# Patient Record
Sex: Male | Born: 1965 | Race: Black or African American | Hispanic: No | Marital: Single | Smoking: Current every day smoker
Health system: Southern US, Community
[De-identification: ages and names within clinical notes are randomized; demographics above are authoritative.]

## PROBLEM LIST (undated history)

## (undated) DIAGNOSIS — S2249XA Multiple fractures of ribs, unspecified side, initial encounter for closed fracture: Secondary | ICD-10-CM

## (undated) DIAGNOSIS — J40 Bronchitis, not specified as acute or chronic: Secondary | ICD-10-CM

## (undated) DIAGNOSIS — S2239XA Fracture of one rib, unspecified side, initial encounter for closed fracture: Secondary | ICD-10-CM

## (undated) DIAGNOSIS — I10 Essential (primary) hypertension: Secondary | ICD-10-CM

## (undated) DIAGNOSIS — J45909 Unspecified asthma, uncomplicated: Secondary | ICD-10-CM

## (undated) HISTORY — PX: NO PAST SURGERIES: SHX2092

---

## 2015-05-20 ENCOUNTER — Encounter (HOSPITAL_COMMUNITY): Payer: Self-pay | Admitting: Emergency Medicine

## 2015-05-20 ENCOUNTER — Emergency Department (HOSPITAL_COMMUNITY): Payer: Self-pay

## 2015-05-20 ENCOUNTER — Emergency Department (HOSPITAL_COMMUNITY)
Admission: EM | Admit: 2015-05-20 | Discharge: 2015-05-21 | Disposition: A | Discharge status: Home / Self Care | Payer: Self-pay | Attending: Emergency Medicine | Admitting: Emergency Medicine

## 2015-05-20 DIAGNOSIS — J45901 Unspecified asthma with (acute) exacerbation: Secondary | ICD-10-CM

## 2015-05-20 DIAGNOSIS — J159 Unspecified bacterial pneumonia: Secondary | ICD-10-CM | POA: Insufficient documentation

## 2015-05-20 DIAGNOSIS — J189 Pneumonia, unspecified organism: Secondary | ICD-10-CM

## 2015-05-20 DIAGNOSIS — F17200 Nicotine dependence, unspecified, uncomplicated: Secondary | ICD-10-CM | POA: Insufficient documentation

## 2015-05-20 DIAGNOSIS — Z8781 Personal history of (healed) traumatic fracture: Secondary | ICD-10-CM | POA: Insufficient documentation

## 2015-05-20 HISTORY — DX: Multiple fractures of ribs, unspecified side, initial encounter for closed fracture: S22.49XA

## 2015-05-20 HISTORY — DX: Fracture of one rib, unspecified side, initial encounter for closed fracture: S22.39XA

## 2015-05-20 HISTORY — DX: Unspecified asthma, uncomplicated: J45.909

## 2015-05-20 HISTORY — DX: Bronchitis, not specified as acute or chronic: J40

## 2015-05-20 LAB — BASIC METABOLIC PANEL
ANION GAP: 15 (ref 5–15)
BUN: 5 mg/dL — ABNORMAL LOW (ref 6–20)
CHLORIDE: 107 mmol/L (ref 101–111)
CO2: 20 mmol/L — AB (ref 22–32)
Calcium: 9.8 mg/dL (ref 8.9–10.3)
Creatinine, Ser: 0.86 mg/dL (ref 0.61–1.24)
GFR calc non Af Amer: >60 mL/min (ref >60)
GLUCOSE: 95 mg/dL (ref 65–99)
Potassium: 4.3 mmol/L (ref 3.5–5.1)
Sodium: 142 mmol/L (ref 135–145)

## 2015-05-20 LAB — I-STAT TROPONIN, ED: Troponin i, poc: 0 ng/mL (ref 0.00–0.08)

## 2015-05-20 LAB — CBC
HEMATOCRIT: 48.4 % (ref 39.0–52.0)
HEMOGLOBIN: 16.8 g/dL (ref 13.0–17.0)
MCH: 33.2 pg (ref 26.0–34.0)
MCHC: 34.7 g/dL (ref 30.0–36.0)
MCV: 95.7 fL (ref 78.0–100.0)
Platelets: 288 10*3/uL (ref 150–400)
RBC: 5.06 MIL/uL (ref 4.22–5.81)
RDW: 12.5 % (ref 11.5–15.5)
WBC: 7.4 10*3/uL (ref 4.0–10.5)

## 2015-05-20 MED ORDER — IPRATROPIUM-ALBUTEROL 0.5-2.5 (3) MG/3ML IN SOLN
3.0000 mL | Freq: Once | RESPIRATORY_TRACT | Status: AC
  Administered 2015-05-20: 3 mL via RESPIRATORY_TRACT
  Filled 2015-05-20: qty 3

## 2015-05-20 MED ORDER — METHYLPREDNISOLONE SODIUM SUCC 125 MG IJ SOLR
125.0000 mg | Freq: Once | INTRAMUSCULAR | Status: AC
  Administered 2015-05-20: 125 mg via INTRAVENOUS
  Filled 2015-05-20: qty 2

## 2015-05-20 NOTE — ED Provider Notes (Signed)
CSN: [id number]     Arrival date & time 05/20/15  2139 History   First MD Initiated Contact with Patient 05/20/15 2204     Chief Complaint  Patient presents with  . Respiratory Distress  . Chest Pain     (Consider location/radiation/quality/duration/timing/severity/associated sxs/prior Treatment) Patient is a 50 y.o. male presenting with chest pain. The history is provided by the patient.  Chest Pain Associated symptoms: cough and shortness of breath   Associated symptoms: no abdominal pain, no fever and no headache    patient with a history of asthma. Stated that he started having significant breathing problems today. Felt like he was wheezing. Associated with some discomfort in the chest. Patient states that he is felt like maybe is coming down with a cold but has not had a fever does not have a sore throat. Patient does not have albuterol at home.  Past Medical History  Diagnosis Date  . Bronchitis   . Broken ribs   . Asthma    History reviewed. No pertinent past surgical history. No family history on file. Social History  Substance Use Topics  . Smoking status: Current Every Day Smoker  . Smokeless tobacco: None  . Alcohol Use: Yes    Review of Systems  Constitutional: Negative for fever.  HENT: Positive for congestion. Negative for sore throat.   Eyes: Negative for visual disturbance.  Respiratory: Positive for cough, shortness of breath and wheezing.   Cardiovascular: Positive for chest pain.  Gastrointestinal: Negative for abdominal pain.  Genitourinary: Negative for dysuria.  Musculoskeletal: Negative for myalgias.  Skin: Negative for rash.  Neurological: Negative for headaches.  Psychiatric/Behavioral: Negative for confusion.      Allergies  Review of patient's allergies indicates no known allergies.  Home Medications   Prior to Admission medications   Medication Sig Start Date End Date Taking? Authorizing Provider  acetaminophen (TYLENOL) 325 MG tablet  Take 325 mg by mouth every 6 (six) hours as needed for mild pain.   Yes Historical Provider, MD  guaiFENesin (MUCINEX) 600 MG 12 hr tablet Take 1,200 mg by mouth 2 (two) times daily as needed for cough or to loosen phlegm.   Yes Historical Provider, MD  azithromycin (ZITHROMAX) 250 MG tablet Take 1 tablet (250 mg total) by mouth daily. Take first 2 tablets together, then 1 every day until finished. 05/21/15   Vanetta MuldersScott Wilfred Dayrit, MD  predniSONE (DELTASONE) 10 MG tablet Take 4 tablets (40 mg total) by mouth daily. 05/21/15   Vanetta MuldersScott Aodhan Scheidt, MD   BP 150/103 mmHg  Pulse 105  Temp(Src) 98.4 F (36.9 C) (Oral)  Resp 12  Ht 5\' 3"  (1.6 m)  Wt 68.493 kg  BMI 26.76 kg/m2  SpO2 98% Physical Exam  Constitutional: He is oriented to person, place, and time. He appears well-developed and well-nourished. No distress.  HENT:  Head: Normocephalic and atraumatic.  Mouth/Throat: Oropharynx is clear and moist.  Eyes: Conjunctivae and EOM are normal. Pupils are equal, round, and reactive to light.  Neck: Normal range of motion.  Cardiovascular: Normal rate and regular rhythm.  Exam reveals no friction rub.   No murmur heard. Pulmonary/Chest: He is in respiratory distress. He has wheezes.  Abdominal: Soft. Bowel sounds are normal. There is no tenderness.  Musculoskeletal: Normal range of motion.  Neurological: He is alert and oriented to person, place, and time. No cranial nerve deficit. He exhibits normal muscle tone. Coordination normal.  Skin: Skin is warm. No rash noted.  Nursing note and vitals  reviewed.   ED Course  Procedures (including critical care time) Labs Review Labs Reviewed  BASIC METABOLIC PANEL - Abnormal; Notable for the following:    CO2 20 (*)    BUN 5 (*)    All other components within normal limits  CBC  I-STAT TROPOININ, ED   Results for orders placed or performed during the hospital encounter of 05/20/15  Basic metabolic panel  Result Value Ref Range   Sodium 142 135 - 145  mmol/L   Potassium 4.3 3.5 - 5.1 mmol/L   Chloride 107 101 - 111 mmol/L   CO2 20 (L) 22 - 32 mmol/L   Glucose, Bld 95 65 - 99 mg/dL   BUN 5 (L) 6 - 20 mg/dL   Creatinine, Ser 7.42 0.61 - 1.24 mg/dL   Calcium 9.8 8.9 - 59.5 mg/dL   GFR calc non Af Amer >60 >60 mL/min   GFR calc Af Amer >60 >60 mL/min   Anion gap 15 5 - 15  CBC  Result Value Ref Range   WBC 7.4 4.0 - 10.5 K/uL   RBC 5.06 4.22 - 5.81 MIL/uL   Hemoglobin 16.8 13.0 - 17.0 g/dL   HCT 63.8 75.6 - 43.3 %   MCV 95.7 78.0 - 100.0 fL   MCH 33.2 26.0 - 34.0 pg   MCHC 34.7 30.0 - 36.0 g/dL   RDW 29.5 18.8 - 41.6 %   Platelets 288 150 - 400 K/uL  I-stat troponin, ED (not at [facility], [facility])  Result Value Ref Range   Troponin i, poc 0.00 0.00 - 0.08 ng/mL   Comment 3            Imaging Review Dg Chest Portable 1 View  05/20/2015  CLINICAL DATA:  Shortness of breath, wheezing, and cough for 1 day. EXAM: PORTABLE CHEST 1 VIEW COMPARISON:  None. FINDINGS: Suggestion of slight nodular infiltration in the right upper lung and left lower lung. This may indicate patchy areas of pneumonia or mucous plugging. Normal heart size and pulmonary vascularity. No blunting of costophrenic angles. No pneumothorax. Mediastinal contours appear intact. IMPRESSION: Patchy areas of nodular infiltration in the right upper lung and left lower lung. Electronically Signed   By: Burman Nieves M.D.   On: 05/20/2015 22:20   I have personally reviewed and evaluated these images and lab results as part of my medical decision-making.   EKG Interpretation   Date/Time:  Sunday May 20 2015 21:45:54 EST Ventricular Rate:  114 PR Interval:  150 QRS Duration: 84 QT Interval:  320 QTC Calculation: 441 R Axis:   9 Text Interpretation:  Sinus tachycardia Septal infarct , age undetermined  Abnormal ECG Confirmed by Latrecia Capito  MD, Leahmarie Gasiorowski [phone]) on 05/20/2015  10:11:23 PM      MDM   Final diagnoses:  Asthma, unspecified asthma severity, with acute exacerbation   CAP (community acquired pneumonia)   Patient with a history of asthma presents with a asthma exacerbation. Improve significantly with 2 nebulizers of albuterol and Atrovent. Patient also given site Medrol. Wheezing is minimal now oxygen saturation saturations are 92%. Chest x-ray raises question of possible early pneumonia. Will treat with Zithromax. Patient will go home with prednisone and albuterol inhaler and will follow-up wellness clinic. Patient return for any new or worse symptoms.  Patient's labs without any significant abnormalities. Patient without any fever.     Vanetta Mulders, MD 05/21/15 [phone]

## 2015-05-20 NOTE — ED Notes (Signed)
Pt arrived to triage in resp distress with pain across chest and sob all day.  Denies nausea and vomiting.  Audible wheezing. Unable to speak in complete sentences.

## 2015-05-21 MED ORDER — ALBUTEROL SULFATE HFA 108 (90 BASE) MCG/ACT IN AERS
2.0000 | INHALATION_SPRAY | Freq: Four times a day (QID) | RESPIRATORY_TRACT | Status: DC
  Administered 2015-05-21: 2 via RESPIRATORY_TRACT
  Filled 2015-05-21: qty 6.7

## 2015-05-21 MED ORDER — PREDNISONE 10 MG PO TABS: 40.0000 mg | ORAL_TABLET | Freq: Every day | ORAL | Status: DC

## 2015-05-21 MED ORDER — AZITHROMYCIN 250 MG PO TABS
500.0000 mg | ORAL_TABLET | Freq: Once | ORAL | Status: AC
  Administered 2015-05-21: 500 mg via ORAL
  Filled 2015-05-21: qty 2

## 2015-05-21 MED ORDER — AZITHROMYCIN 250 MG PO TABS: 250.0000 mg | ORAL_TABLET | Freq: Every day | ORAL | Status: DC

## 2015-05-21 NOTE — Discharge Instructions (Signed)
Asthma, Acute Bronchospasm °Acute bronchospasm caused by asthma is also referred to as an asthma attack. Bronchospasm means your air passages become narrowed. The narrowing is caused by inflammation and tightening of the muscles in the air tubes (bronchi) in your lungs. This can make it hard to breathe or cause you to wheeze and cough. °CAUSES °Possible triggers are: °· Animal dander from the skin, hair, or feathers of animals. °· Dust mites contained in house dust. °· Cockroaches. °· Pollen from trees or grass. °· Mold. °· Cigarette or tobacco smoke. °· Air pollutants such as dust, household cleaners, hair sprays, aerosol sprays, paint fumes, strong chemicals, or strong odors. °· Cold air or weather changes. Cold air may trigger inflammation. Winds increase molds and pollens in the air. °· Strong emotions such as crying or laughing hard. °· Stress. °· Certain medicines such as aspirin or beta-blockers. °· Sulfites in foods and drinks, such as dried fruits and wine. °· Infections or inflammatory conditions, such as a flu, cold, or inflammation of the nasal membranes (rhinitis). °· Gastroesophageal reflux disease (GERD). GERD is a condition where stomach acid backs up into your esophagus. °· Exercise or strenuous activity. °SIGNS AND SYMPTOMS  °· Wheezing. °· Excessive coughing, particularly at night. °· Chest tightness. °· Shortness of breath. °DIAGNOSIS  °Your health care provider will ask you about your medical history and perform a physical exam. A chest X-ray or blood testing may be performed to look for other causes of your symptoms or other conditions that may have triggered your asthma attack.  °TREATMENT  °Treatment is aimed at reducing inflammation and opening up the airways in your lungs.  Most asthma attacks are treated with inhaled medicines. These include quick relief or rescue medicines (such as bronchodilators) and controller medicines (such as inhaled corticosteroids). These medicines are sometimes  given through an inhaler or a nebulizer. Systemic steroid medicine taken by mouth or given through an IV tube also can be used to reduce the inflammation when an attack is moderate or severe. Antibiotic medicines are only used if a bacterial infection is present.  °HOME CARE INSTRUCTIONS  °· Rest. °· Drink plenty of liquids. This helps the mucus to remain thin and be easily coughed up. Only use caffeine in moderation and do not use alcohol until you have recovered from your illness. °· Do not smoke. Avoid being exposed to secondhand smoke. °· You play a critical role in keeping yourself in good health. Avoid exposure to things that cause you to wheeze or to have breathing problems. °· Keep your medicines up-to-date and available. Carefully follow your health care provider's treatment plan. °· Take your medicine exactly as prescribed. °· When pollen or pollution is bad, keep windows closed and use an air conditioner or go to places with air conditioning. °· Asthma requires careful medical care. See your health care provider for a follow-up as advised. If you are more than [redacted] weeks pregnant and you were prescribed any new medicines, let your obstetrician know about the visit and how you are doing. Follow up with your health care provider as directed. °· After you have recovered from your asthma attack, make an appointment with your outpatient doctor to talk about ways to reduce the likelihood of future attacks. If you do not have a doctor who manages your asthma, make an appointment with a primary care doctor to discuss your asthma. °SEEK IMMEDIATE MEDICAL CARE IF:  °· You are getting worse. °· You have trouble breathing. If severe, call your local   emergency services (911 in the [place]  You develop chest pain or discomfort.  You are vomiting.  You are not able to keep fluids down.  You are coughing up yellow, green, brown, or bloody sputum.  You have a fever and your symptoms suddenly get worse.  You have  trouble swallowing. MAKE SURE YOU:   Understand these instructions.  Will watch your condition.  Will get help right away if you are not doing well or get worse.   This information is not intended to replace advice given to you by your health care provider. Make sure you discuss any questions you have with your health care provider.   Use albuterol inhaler 2 puffs every 6 hours for the next 7 days. Then as needed. Take prednisone for the next 5 days. Take the antibiotic as directed. Return for any new or worse symptoms. Make an appointment to follow-up with the wellness clinic.   Document Released: 06/18/2006 Document Revised: 03/08/2013 Document Reviewed: 09/08/2012 Elsevier Interactive Patient Education [employer]2016 [employer] Inc.

## 2015-05-21 NOTE — ED Notes (Signed)
Patient verbalized understanding of discharge instructions and denies any further needs or questions at this time. VS stable. Patient ambulatory with steady gait.  

## 2016-09-19 ENCOUNTER — Emergency Department (HOSPITAL_COMMUNITY)
Admission: EM | Admit: 2016-09-19 | Discharge: 2016-09-19 | Disposition: A | Discharge status: Home / Self Care | Payer: Self-pay | Attending: Emergency Medicine | Admitting: Emergency Medicine

## 2016-09-19 ENCOUNTER — Emergency Department (HOSPITAL_COMMUNITY): Payer: Self-pay

## 2016-09-19 ENCOUNTER — Encounter (HOSPITAL_COMMUNITY): Payer: Self-pay

## 2016-09-19 DIAGNOSIS — Z7951 Long term (current) use of inhaled steroids: Secondary | ICD-10-CM | POA: Insufficient documentation

## 2016-09-19 DIAGNOSIS — J4521 Mild intermittent asthma with (acute) exacerbation: Secondary | ICD-10-CM | POA: Insufficient documentation

## 2016-09-19 DIAGNOSIS — F17210 Nicotine dependence, cigarettes, uncomplicated: Secondary | ICD-10-CM | POA: Insufficient documentation

## 2016-09-19 MED ORDER — ALBUTEROL SULFATE (2.5 MG/3ML) 0.083% IN NEBU
5.0000 mg | INHALATION_SOLUTION | Freq: Once | RESPIRATORY_TRACT | Status: AC
  Administered 2016-09-19: 5 mg via RESPIRATORY_TRACT

## 2016-09-19 MED ORDER — IPRATROPIUM-ALBUTEROL 0.5-2.5 (3) MG/3ML IN SOLN
RESPIRATORY_TRACT | Status: AC
  Filled 2016-09-19: qty 3

## 2016-09-19 MED ORDER — PREDNISONE 10 MG PO TABS: 40.0000 mg | ORAL_TABLET | Freq: Every day | ORAL | 0 refills | Status: DC

## 2016-09-19 MED ORDER — ALBUTEROL SULFATE HFA 108 (90 BASE) MCG/ACT IN AERS
2.0000 | INHALATION_SPRAY | Freq: Once | RESPIRATORY_TRACT | Status: AC
  Administered 2016-09-19: 2 via RESPIRATORY_TRACT
  Filled 2016-09-19: qty 6.7

## 2016-09-19 MED ORDER — IPRATROPIUM-ALBUTEROL 0.5-2.5 (3) MG/3ML IN SOLN
3.0000 mL | Freq: Once | RESPIRATORY_TRACT | Status: AC
  Administered 2016-09-19: 3 mL via RESPIRATORY_TRACT

## 2016-09-19 MED ORDER — ALBUTEROL SULFATE HFA 108 (90 BASE) MCG/ACT IN AERS: 2.0000 | INHALATION_SPRAY | RESPIRATORY_TRACT | 1 refills | Status: DC | PRN

## 2016-09-19 MED ORDER — PREDNISONE 20 MG PO TABS
60.0000 mg | ORAL_TABLET | Freq: Once | ORAL | Status: AC
  Administered 2016-09-19: 60 mg via ORAL
  Filled 2016-09-19: qty 3

## 2016-09-19 MED ORDER — ALBUTEROL SULFATE (2.5 MG/3ML) 0.083% IN NEBU
INHALATION_SOLUTION | RESPIRATORY_TRACT | Status: AC
  Filled 2016-09-19: qty 6

## 2016-09-19 MED ORDER — ALBUTEROL SULFATE (2.5 MG/3ML) 0.083% IN NEBU: 5.0000 mg | INHALATION_SOLUTION | Freq: Once | RESPIRATORY_TRACT | Status: DC

## 2016-09-19 NOTE — ED Notes (Signed)
The pt has had diffiuclty breathing all day today.  He has not had any breathing difficulty for years.  Hx of asthma years ago.  At present no breathing difficulty at all.  He does not have an inhaler.  He is a smoker

## 2016-09-19 NOTE — ED Notes (Signed)
Patient transported to X-ray. Breathing treatment continuing with O2 tank.

## 2016-09-19 NOTE — ED Notes (Signed)
Med given  Pt has sl respiratory wheezes  expiratory

## 2016-09-19 NOTE — ED Triage Notes (Signed)
Per Pt, Pt is coming from home with complaints of wheezing, SOB, and chest pain. Pt has hx of asthma and reports worsening breathing and cough in the last couple days. Pt is tachypnic upon arrival.

## 2016-09-19 NOTE — ED Notes (Signed)
The pt reports that he has  Not had high bp in the past  His bp is consistently high here  Advised to have a doctor check it

## 2016-09-19 NOTE — ED Provider Notes (Signed)
[facility]-EMERGENCY DEPT Provider Note   CSN: [id number] Arrival date & time: 09/19/16  4098     History   Chief Complaint Chief Complaint  Patient presents with  . Asthma    HPI Richard Robinson is a 51 y.o. male.  The history is provided by the patient.  Shortness of Breath  This is a recurrent problem. The average episode lasts 4 hours. The problem occurs intermittently.The current episode started 1 to 2 hours ago. The problem has been gradually improving. Associated symptoms include cough and wheezing. Pertinent negatives include no fever, no sore throat, no ear pain, no sputum production, no chest pain, no vomiting, no abdominal pain and no rash. He has tried ipratropium inhalers and beta-agonist inhalers for the symptoms. The treatment provided mild relief. He has had prior ED visits. Associated medical issues include asthma.    Past Medical History:  Diagnosis Date  . Asthma   . Broken ribs   . Bronchitis     There are no active problems to display for this patient.   History reviewed. No pertinent surgical history.     Home Medications    Prior to Admission medications   Medication Sig Start Date End Date Taking? Authorizing Provider  albuterol (PROVENTIL HFA;VENTOLIN HFA) 108 (90 Base) MCG/ACT inhaler Inhale 2 puffs into the lungs every 4 (four) hours as needed for wheezing or shortness of breath. 09/19/16   Orson Slick, MD  azithromycin (ZITHROMAX) 250 MG tablet Take 1 tablet (250 mg total) by mouth daily. Take first 2 tablets together, then 1 every day until finished. Patient not taking: Reported on 09/19/2016 05/21/15   Vanetta Mulders, MD  predniSONE (DELTASONE) 10 MG tablet Take 4 tablets (40 mg total) by mouth daily. Patient not taking: Reported on 09/19/2016 05/21/15   Vanetta Mulders, MD  predniSONE (DELTASONE) 10 MG tablet Take 4 tablets (40 mg total) by mouth daily. 09/19/16   Orson Slick, MD    Family History No family history on file.  Social  History Social History  Substance Use Topics  . Smoking status: Current Every Day Smoker  . Smokeless tobacco: Never Used  . Alcohol use Yes     Allergies   Patient has no known allergies.   Review of Systems Review of Systems  Constitutional: Negative for chills and fever.  HENT: Negative for ear pain and sore throat.   Eyes: Negative for pain and visual disturbance.  Respiratory: Positive for cough, chest tightness, shortness of breath and wheezing. Negative for sputum production, choking and stridor.   Cardiovascular: Negative for chest pain and palpitations.  Gastrointestinal: Negative for abdominal pain and vomiting.  Genitourinary: Negative for dysuria and hematuria.  Musculoskeletal: Negative for arthralgias and back pain.  Skin: Negative for color change and rash.  Neurological: Negative for seizures and syncope.  All other systems reviewed and are negative.    Physical Exam Updated Vital Signs BP (!) 152/101   Pulse 91   Temp 98.6 F (37 C) (Oral)   Resp (!) 24   Ht 5\' 8"  (1.727 m)   Wt 68 kg (150 lb)   SpO2 96%   BMI 22.81 kg/m   Physical Exam  Constitutional: He is oriented to person, place, and time. He appears well-developed and well-nourished.  HENT:  Head: Normocephalic and atraumatic.  Mouth/Throat: Oropharynx is clear and moist.  Eyes: Conjunctivae are normal.  Neck: Neck supple.  Cardiovascular: Regular rhythm.   No murmur heard. Tachycardia  Pulmonary/Chest: He has wheezes. He has no  rales. He exhibits no tenderness.  Patient with mild tachypnea and diffuse wheezing with no focality  Abdominal: Soft. There is no tenderness.  Musculoskeletal: He exhibits no edema or deformity.  Neurological: He is alert and oriented to person, place, and time.  Skin: Skin is warm and dry.  Psychiatric: He has a normal mood and affect.  Nursing note and vitals reviewed.    ED Treatments / Results  Labs (all labs ordered are listed, but only abnormal  results are displayed) Labs Reviewed - No data to display  EKG  EKG Interpretation None       Radiology Dg Chest 2 View  Result Date: 09/19/2016 CLINICAL DATA:  Shortness of breath with cough EXAM: CHEST  2 VIEW COMPARISON:  May 20, 2015 FINDINGS: There is no edema or consolidation. The heart size and pulmonary vascularity are normal. No adenopathy. No bone lesions. IMPRESSION: No edema or consolidation. Electronically Signed   By: Bretta Bang III M.D.   On: 09/19/2016 19:21    Procedures Procedures (including critical care time)  Medications Ordered in ED Medications  ipratropium-albuterol (DUONEB) 0.5-2.5 (3) MG/3ML nebulizer solution (not administered)  albuterol (PROVENTIL) (2.5 MG/3ML) 0.083% nebulizer solution (  Not Given 09/19/16 1916)  ipratropium-albuterol (DUONEB) 0.5-2.5 (3) MG/3ML nebulizer solution 3 mL (3 mLs Nebulization Given 09/19/16 1843)  albuterol (PROVENTIL) (2.5 MG/3ML) 0.083% nebulizer solution 5 mg (5 mg Nebulization Given 09/19/16 1858)  predniSONE (DELTASONE) tablet 60 mg (60 mg Oral Given 09/19/16 2002)  albuterol (PROVENTIL HFA;VENTOLIN HFA) 108 (90 Base) MCG/ACT inhaler 2 puff (2 puffs Inhalation Given 09/19/16 2114)     Initial Impression / Assessment and Plan / ED Course  I have reviewed the triage vital signs and the nursing notes.  Pertinent labs & imaging results that were available during my care of the patient were reviewed by me and considered in my medical decision making (see chart for details).     Richard Robinson is a 51 year old male history of asthma coming in today with concern for asthma attack. Patient stated around 4:56 PM he began having some shortness of breath. No known inciting events. States he used to have an albuterol inhaler however has not had one recently. Patient endorses wheezing shortness of breath and some mild chest tightness. Dorsalis and occasional dry cough. No fevers, chills, nausea, vomiting, abdominal pain. States this  feels like his normal asthma exacerbations.  On exam he is tachycardic and still mildly tachypneic after a couple of albuterol breathing treatments. Speaking in short phrases. Chest x-ray shows no obvious cardiopulmonary abnormality. No pneumonia. We'll give prednisone and DuoNeb. EKG shows sinus tachycardia with a ventricular rate of 119 bpm, PR interval 124 ms, QRS duration 88 ms, QTC 469 ms. No ST segment elevations or other signs of acute ischemia. Doubt ACS, PE, and this is likely an asthma exacerbation.  Upon recheck around 9:15 PM, patient voices feeling much better and on my reassessment physical exam he has much improved wheezing which is now only expiratory. Patient will be given albuterol inhaler to go home and instructed to use it every 4 hours over the next 24 hours as well as a steroid prescription. He voiced understanding and agreement to the plan and was comfortable with discharge home with outpatient follow-up. Given the number to contact to establish a primary care physician. Vital signs are stable and within normal limits at time of discharge.  Patient was seen with my attending, Dr. Deretha Emory, who voiced agreement and oversaw the evaluation and  treatment of this patient.   Dragon [employer] illustratorvoice dictation software was used in the creation of this note. If there are any errors or inconsistencies needing clarification, please contact me directly.   Final Clinical Impressions(s) / ED Diagnoses   Final diagnoses:  Mild intermittent asthma with exacerbation    New Prescriptions New Prescriptions   ALBUTEROL (PROVENTIL HFA;VENTOLIN HFA) 108 (90 BASE) MCG/ACT INHALER    Inhale 2 puffs into the lungs every 4 (four) hours as needed for wheezing or shortness of breath.   PREDNISONE (DELTASONE) 10 MG TABLET    Take 4 tablets (40 mg total) by mouth daily.     Orson Slickolson, Karron Alvizo, MD 09/19/16 2126    Vanetta MuldersZackowski, Scott, MD 09/19/16 2133

## 2016-09-19 NOTE — ED Notes (Signed)
Inhaler given with 2 puffs

## 2018-01-12 IMAGING — DX DG CHEST 2V
2 series · 2 of 2 positions shown · non-contrast
Comparison: May 20, 2015

CLINICAL DATA: Shortness of breath with cough

EXAM:
CHEST  2 VIEW

[chest pa]
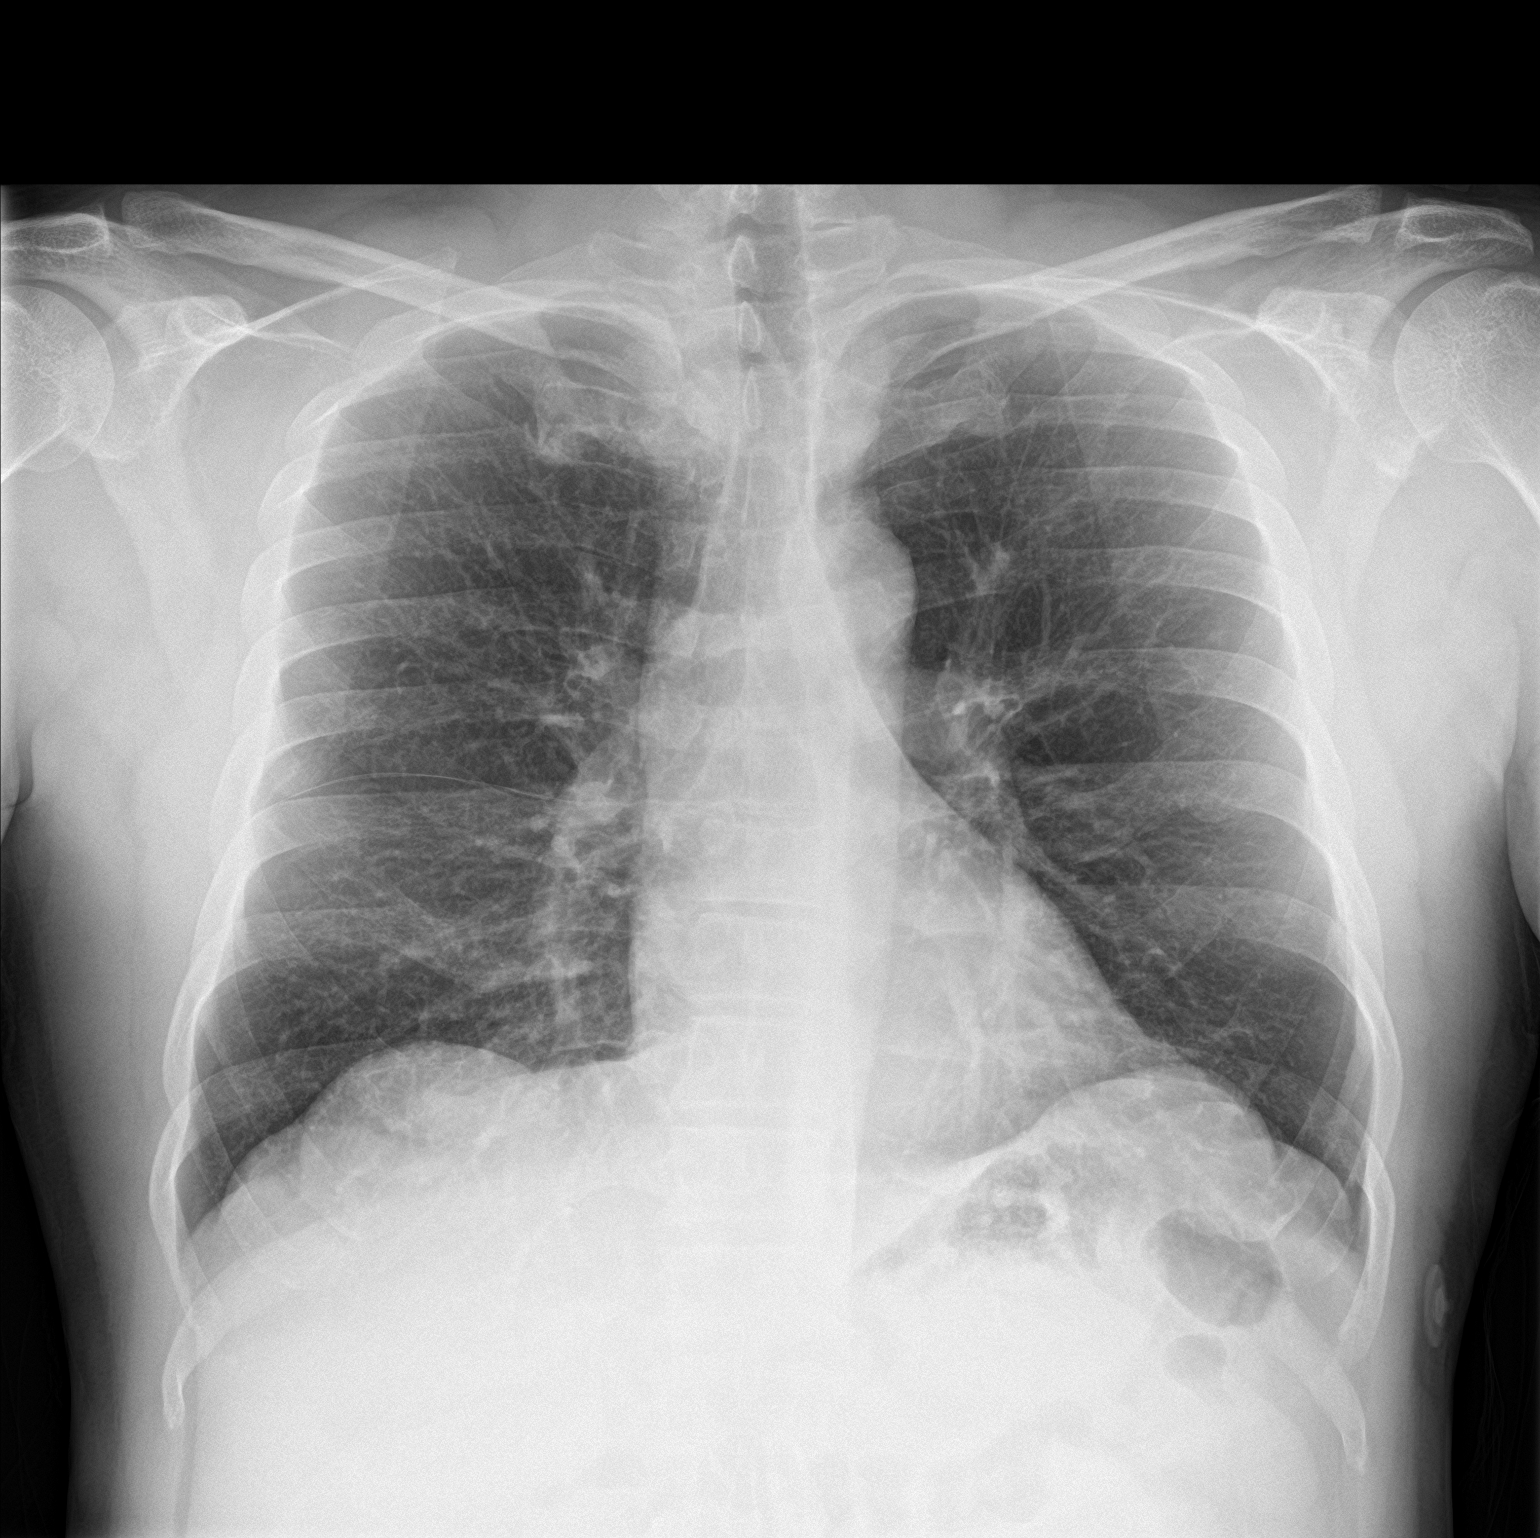

[chest lat]
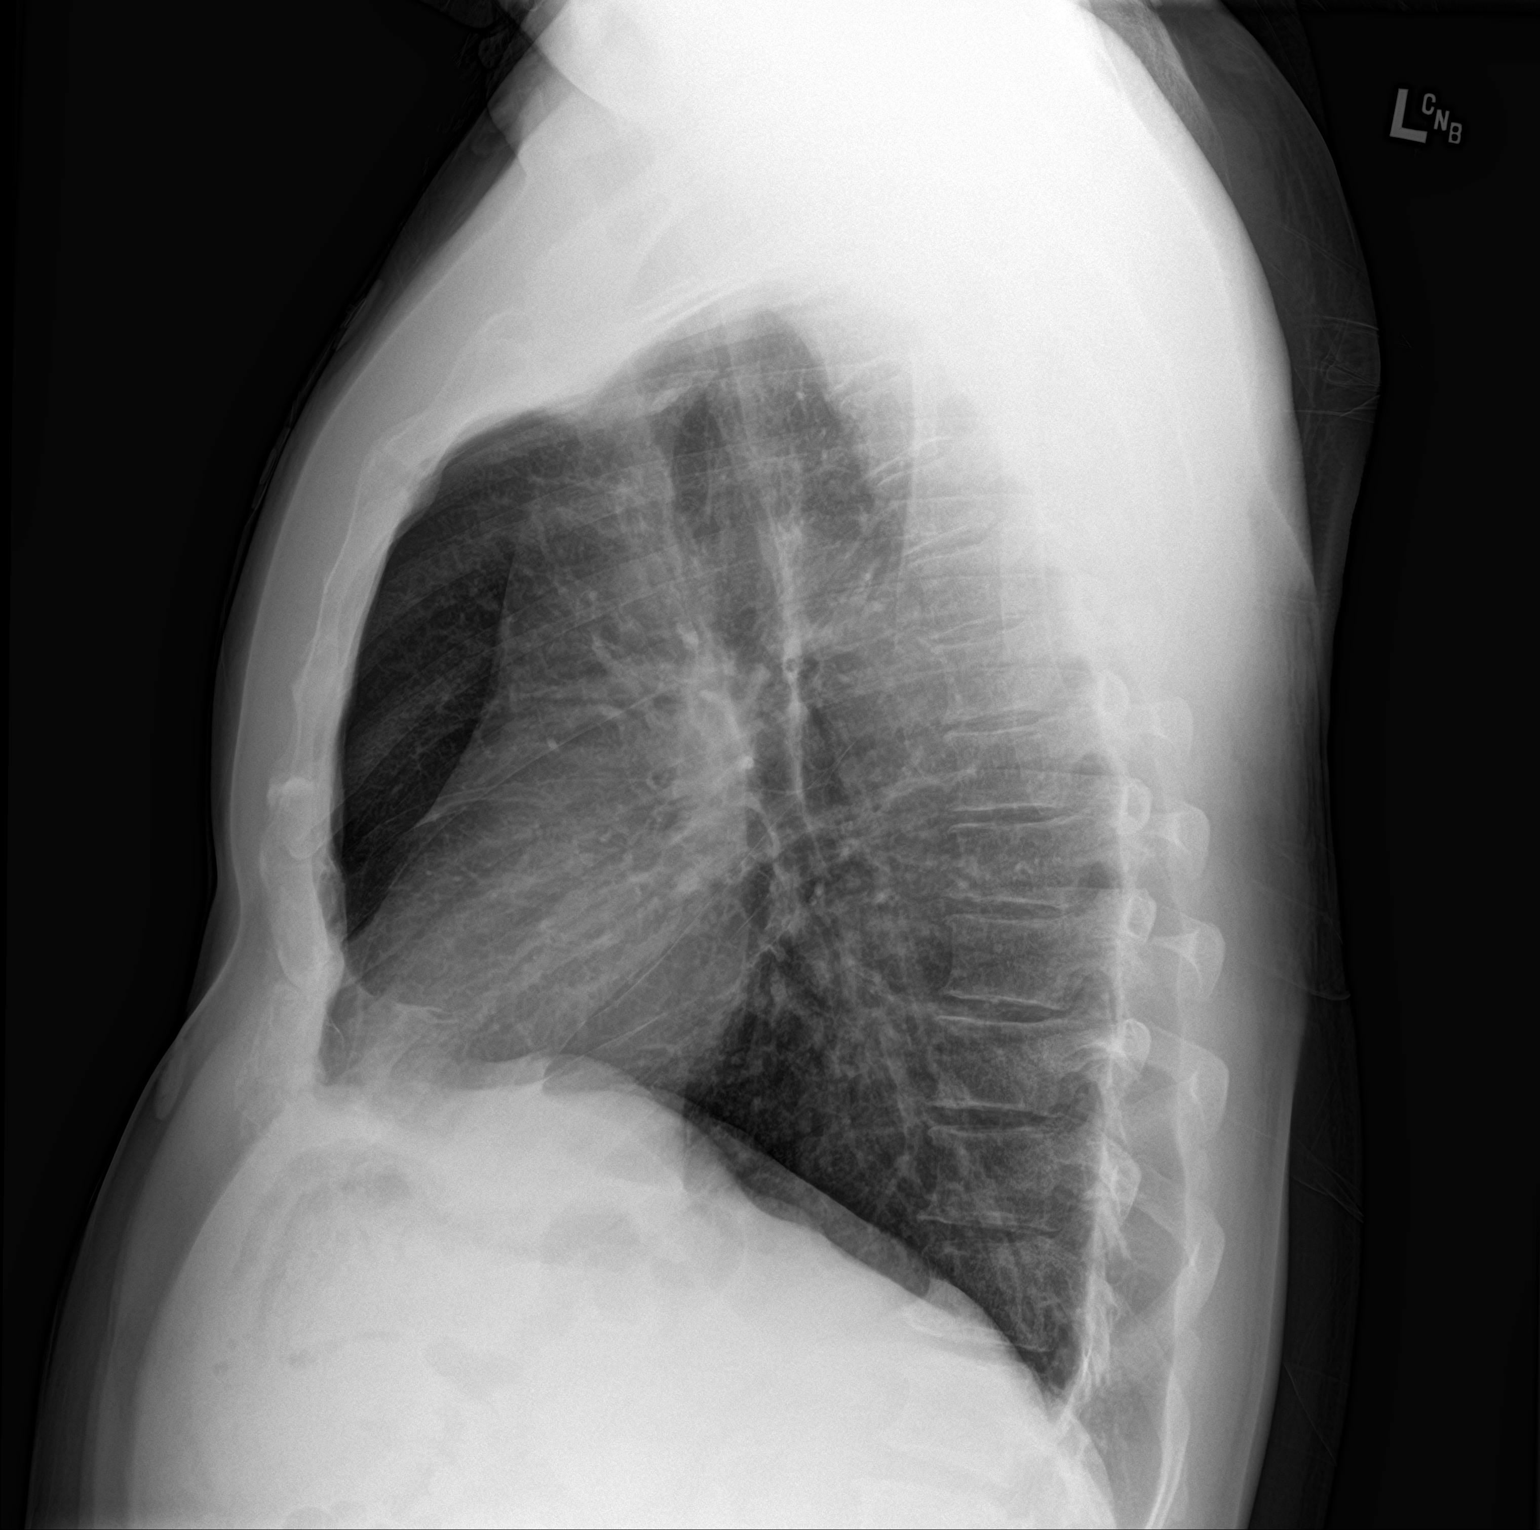

[2 of 2 positions shown; findings below may reference images not displayed]

FINDINGS: There is no edema or consolidation. The heart size and pulmonary
vascularity are normal. No adenopathy. No bone lesions.
IMPRESSION: No edema or consolidation.

## 2020-06-14 ENCOUNTER — Emergency Department (HOSPITAL_COMMUNITY): Payer: Self-pay

## 2020-06-14 ENCOUNTER — Inpatient Hospital Stay (HOSPITAL_COMMUNITY)
Admission: EM | Admit: 2020-06-14 | Discharge: 2020-06-18 | DRG: 871 | Disposition: A | Discharge status: Home / Self Care | Payer: Self-pay | Attending: Internal Medicine | Admitting: Internal Medicine

## 2020-06-14 ENCOUNTER — Encounter (HOSPITAL_COMMUNITY): Payer: Self-pay | Admitting: Internal Medicine

## 2020-06-14 ENCOUNTER — Other Ambulatory Visit: Payer: Self-pay

## 2020-06-14 DIAGNOSIS — F17200 Nicotine dependence, unspecified, uncomplicated: Secondary | ICD-10-CM | POA: Diagnosis present

## 2020-06-14 DIAGNOSIS — Z23 Encounter for immunization: Secondary | ICD-10-CM

## 2020-06-14 DIAGNOSIS — N179 Acute kidney failure, unspecified: Secondary | ICD-10-CM | POA: Diagnosis present

## 2020-06-14 DIAGNOSIS — Z20822 Contact with and (suspected) exposure to covid-19: Secondary | ICD-10-CM | POA: Diagnosis present

## 2020-06-14 DIAGNOSIS — J45909 Unspecified asthma, uncomplicated: Secondary | ICD-10-CM | POA: Diagnosis present

## 2020-06-14 DIAGNOSIS — E871 Hypo-osmolality and hyponatremia: Secondary | ICD-10-CM | POA: Diagnosis present

## 2020-06-14 DIAGNOSIS — R6520 Severe sepsis without septic shock: Secondary | ICD-10-CM

## 2020-06-14 DIAGNOSIS — A419 Sepsis, unspecified organism: Principal | ICD-10-CM | POA: Diagnosis present

## 2020-06-14 DIAGNOSIS — E876 Hypokalemia: Secondary | ICD-10-CM | POA: Diagnosis present

## 2020-06-14 DIAGNOSIS — J9601 Acute respiratory failure with hypoxia: Secondary | ICD-10-CM | POA: Diagnosis present

## 2020-06-14 DIAGNOSIS — J189 Pneumonia, unspecified organism: Secondary | ICD-10-CM | POA: Diagnosis present

## 2020-06-14 DIAGNOSIS — R079 Chest pain, unspecified: Secondary | ICD-10-CM

## 2020-06-14 DIAGNOSIS — Z716 Tobacco abuse counseling: Secondary | ICD-10-CM

## 2020-06-14 HISTORY — DX: Essential (primary) hypertension: I10

## 2020-06-14 LAB — CBC WITH DIFFERENTIAL/PLATELET
Abs Immature Granulocytes: 0.24 10*3/uL — ABNORMAL HIGH (ref 0.00–0.07)
Basophils Absolute: 0.1 10*3/uL (ref 0.0–0.1)
Basophils Relative: 0 %
Eosinophils Absolute: 0 10*3/uL (ref 0.0–0.5)
Eosinophils Relative: 0 %
HCT: 40.3 % (ref 39.0–52.0)
Hemoglobin: 14 g/dL (ref 13.0–17.0)
Immature Granulocytes: 2 %
Lymphocytes Relative: 5 %
Lymphs Abs: 0.8 10*3/uL (ref 0.7–4.0)
MCH: 34.1 pg — ABNORMAL HIGH (ref 26.0–34.0)
MCHC: 34.7 g/dL (ref 30.0–36.0)
MCV: 98.3 fL (ref 80.0–100.0)
Monocytes Absolute: 0.9 10*3/uL (ref 0.1–1.0)
Monocytes Relative: 6 %
Neutro Abs: 13.9 10*3/uL — ABNORMAL HIGH (ref 1.7–7.7)
Neutrophils Relative %: 87 %
Platelets: 626 10*3/uL — ABNORMAL HIGH (ref 150–400)
RBC: 4.1 MIL/uL — ABNORMAL LOW (ref 4.22–5.81)
RDW: 12.4 % (ref 11.5–15.5)
WBC: 15.5 10*3/uL — ABNORMAL HIGH (ref 4.0–10.5)
nRBC: 0 % (ref 0.0–0.2)

## 2020-06-14 LAB — PROTIME-INR
INR: 1.1 (ref 0.8–1.2)
Prothrombin Time: 13.8 seconds (ref 11.4–15.2)

## 2020-06-14 LAB — URINALYSIS, ROUTINE W REFLEX MICROSCOPIC
Bilirubin Urine: NEGATIVE
Glucose, UA: NEGATIVE mg/dL
Hgb urine dipstick: NEGATIVE
Ketones, ur: NEGATIVE mg/dL
Leukocytes,Ua: NEGATIVE
Nitrite: NEGATIVE
Protein, ur: NEGATIVE mg/dL
Specific Gravity, Urine: 1.014 (ref 1.005–1.030)
pH: 5 (ref 5.0–8.0)

## 2020-06-14 LAB — COMPREHENSIVE METABOLIC PANEL
ALT: 14 U/L (ref 0–44)
AST: 20 U/L (ref 15–41)
Albumin: 2.5 g/dL — ABNORMAL LOW (ref 3.5–5.0)
Alkaline Phosphatase: 38 U/L (ref 38–126)
Anion gap: 11 (ref 5–15)
BUN: 9 mg/dL (ref 6–20)
CO2: 23 mmol/L (ref 22–32)
Calcium: 8.7 mg/dL — ABNORMAL LOW (ref 8.9–10.3)
Chloride: 95 mmol/L — ABNORMAL LOW (ref 98–111)
Creatinine, Ser: 1.46 mg/dL — ABNORMAL HIGH (ref 0.61–1.24)
GFR, Estimated: 58 mL/min — ABNORMAL LOW (ref >60)
Glucose, Bld: 138 mg/dL — ABNORMAL HIGH (ref 70–99)
Potassium: 3.2 mmol/L — ABNORMAL LOW (ref 3.5–5.1)
Sodium: 129 mmol/L — ABNORMAL LOW (ref 135–145)
Total Bilirubin: 0.6 mg/dL (ref 0.3–1.2)
Total Protein: 7.3 g/dL (ref 6.5–8.1)

## 2020-06-14 LAB — BASIC METABOLIC PANEL
Anion gap: 11 (ref 5–15)
BUN: 10 mg/dL (ref 6–20)
CO2: 23 mmol/L (ref 22–32)
Calcium: 8.3 mg/dL — ABNORMAL LOW (ref 8.9–10.3)
Chloride: 98 mmol/L (ref 98–111)
Creatinine, Ser: 1.23 mg/dL (ref 0.61–1.24)
GFR, Estimated: >60 mL/min (ref >60)
Glucose, Bld: 122 mg/dL — ABNORMAL HIGH (ref 70–99)
Potassium: 3.1 mmol/L — ABNORMAL LOW (ref 3.5–5.1)
Sodium: 132 mmol/L — ABNORMAL LOW (ref 135–145)

## 2020-06-14 LAB — CBC
HCT: 37.8 % — ABNORMAL LOW (ref 39.0–52.0)
Hemoglobin: 12.9 g/dL — ABNORMAL LOW (ref 13.0–17.0)
MCH: 33.9 pg (ref 26.0–34.0)
MCHC: 34.1 g/dL (ref 30.0–36.0)
MCV: 99.2 fL (ref 80.0–100.0)
Platelets: 583 10*3/uL — ABNORMAL HIGH (ref 150–400)
RBC: 3.81 MIL/uL — ABNORMAL LOW (ref 4.22–5.81)
RDW: 12.8 % (ref 11.5–15.5)
WBC: 15.1 10*3/uL — ABNORMAL HIGH (ref 4.0–10.5)
nRBC: 0 % (ref 0.0–0.2)

## 2020-06-14 LAB — HIV ANTIBODY (ROUTINE TESTING W REFLEX): HIV Screen 4th Generation wRfx: NONREACTIVE

## 2020-06-14 LAB — LACTIC ACID, PLASMA
Lactic Acid, Venous: 2.1 mmol/L (ref 0.5–1.9)
Lactic Acid, Venous: 1.5 mmol/L (ref 0.5–1.9)

## 2020-06-14 LAB — RESP PANEL BY RT-PCR (FLU A&B, COVID) ARPGX2
Influenza A by PCR: NEGATIVE
Influenza B by PCR: NEGATIVE
SARS Coronavirus 2 by RT PCR: NEGATIVE

## 2020-06-14 MED ORDER — ALBUTEROL SULFATE HFA 108 (90 BASE) MCG/ACT IN AERS: 2.0000 | INHALATION_SPRAY | RESPIRATORY_TRACT | Status: DC | PRN

## 2020-06-14 MED ORDER — ALBUTEROL SULFATE (2.5 MG/3ML) 0.083% IN NEBU
2.5000 mg | INHALATION_SOLUTION | RESPIRATORY_TRACT | Status: DC | PRN
  Administered 2020-06-15: 2.5 mg via RESPIRATORY_TRACT
  Filled 2020-06-14: qty 3

## 2020-06-14 MED ORDER — METHYLPREDNISOLONE SODIUM SUCC 40 MG IJ SOLR
40.0000 mg | Freq: Two times a day (BID) | INTRAMUSCULAR | Status: DC
  Administered 2020-06-14 – 2020-06-18 (×8): 40 mg via INTRAVENOUS
  Filled 2020-06-14 (×8): qty 1

## 2020-06-14 MED ORDER — POTASSIUM CHLORIDE CRYS ER 20 MEQ PO TBCR
30.0000 meq | EXTENDED_RELEASE_TABLET | Freq: Once | ORAL | Status: AC
  Administered 2020-06-14: 30 meq via ORAL
  Filled 2020-06-14: qty 1

## 2020-06-14 MED ORDER — LACTATED RINGERS IV SOLN: INTRAVENOUS | Status: DC

## 2020-06-14 MED ORDER — ACETAMINOPHEN 325 MG PO TABS: 650.0000 mg | ORAL_TABLET | Freq: Four times a day (QID) | ORAL | Status: DC | PRN

## 2020-06-14 MED ORDER — ENOXAPARIN SODIUM 40 MG/0.4ML ~~LOC~~ SOLN
40.0000 mg | SUBCUTANEOUS | Status: DC
  Administered 2020-06-14 – 2020-06-17 (×4): 40 mg via SUBCUTANEOUS
  Filled 2020-06-14 (×4): qty 0.4

## 2020-06-14 MED ORDER — SODIUM CHLORIDE 0.9 % IV SOLN
1.0000 g | Freq: Once | INTRAVENOUS | Status: AC
  Administered 2020-06-14: 1 g via INTRAVENOUS
  Filled 2020-06-14: qty 10

## 2020-06-14 MED ORDER — SODIUM CHLORIDE 0.9 % IV BOLUS
1000.0000 mL | Freq: Once | INTRAVENOUS | Status: AC
  Administered 2020-06-14: 1000 mL via INTRAVENOUS

## 2020-06-14 MED ORDER — ACETAMINOPHEN 325 MG PO TABS
650.0000 mg | ORAL_TABLET | Freq: Once | ORAL | Status: AC | PRN
  Administered 2020-06-14: 650 mg via ORAL
  Filled 2020-06-14: qty 2

## 2020-06-14 MED ORDER — INFLUENZA VAC SPLIT QUAD 0.5 ML IM SUSY
0.5000 mL | PREFILLED_SYRINGE | INTRAMUSCULAR | Status: AC
  Administered 2020-06-16: 0.5 mL via INTRAMUSCULAR
  Filled 2020-06-14: qty 0.5

## 2020-06-14 MED ORDER — SODIUM CHLORIDE 0.9 % IV SOLN
2.0000 g | INTRAVENOUS | Status: DC
  Administered 2020-06-15 – 2020-06-18 (×4): 2 g via INTRAVENOUS
  Filled 2020-06-14 (×4): qty 20

## 2020-06-14 MED ORDER — PANTOPRAZOLE SODIUM 40 MG PO TBEC
40.0000 mg | DELAYED_RELEASE_TABLET | Freq: Every day | ORAL | Status: DC
  Administered 2020-06-14 – 2020-06-18 (×5): 40 mg via ORAL
  Filled 2020-06-14 (×5): qty 1

## 2020-06-14 MED ORDER — PHENOL 1.4 % MT LIQD
1.0000 | OROMUCOSAL | Status: DC | PRN
  Administered 2020-06-14: 1 via OROMUCOSAL
  Filled 2020-06-14: qty 177

## 2020-06-14 MED ORDER — AZITHROMYCIN 500 MG IV SOLR
500.0000 mg | INTRAVENOUS | Status: DC
  Administered 2020-06-15 – 2020-06-18 (×4): 500 mg via INTRAVENOUS
  Filled 2020-06-14 (×4): qty 500

## 2020-06-14 MED ORDER — PNEUMOCOCCAL VAC POLYVALENT 25 MCG/0.5ML IJ INJ
0.5000 mL | INJECTION | INTRAMUSCULAR | Status: AC
  Administered 2020-06-16: 0.5 mL via INTRAMUSCULAR
  Filled 2020-06-14: qty 0.5

## 2020-06-14 MED ORDER — SODIUM CHLORIDE 0.9 % IV SOLN
500.0000 mg | Freq: Once | INTRAVENOUS | Status: AC
  Administered 2020-06-14: 500 mg via INTRAVENOUS
  Filled 2020-06-14: qty 500

## 2020-06-14 NOTE — ED Notes (Signed)
Pt placed on bedside commode.  

## 2020-06-14 NOTE — ED Notes (Signed)
Transport paged

## 2020-06-14 NOTE — H&P (Signed)
History and Physical    Richard Robinson LPF:[id number] DOB: 1966/02/11 DOA: 06/14/2020  PCP: Patient, No Pcp Per (Inactive)  Patient coming from: Home  I have personally briefly reviewed patient's old medical records in [facility]  Chief Complaint: Fever, fatigue  HPI: Richard Robinson is a 55 y.o. male with medical history significant of asthma.  Pt presents to the ED with 1 week history of fever, cough, runny nose, sore throat, generalized weakness.  Has some SOB.  Denies sick contacts.  Covid vaccinated.  Not O2 dependent at baseline, uses PRN inhalers for asthma management.  No hemoptysis.   ED Course: LUL PNA on CXR.  Tm 103.x, BP as low as 88/61, given 1L bolus.  New 2L O2 requirement (satting as low as 86 on RA in ED).  Sodium 129  Creat 1.46 up from 1.0 baseline in 2020.   Review of Systems: As per HPI, otherwise all review of systems negative.  Past Medical History:  Diagnosis Date  . Asthma   . Broken ribs   . Bronchitis     No past surgical history on file.   reports that he has been smoking. He has never used smokeless tobacco. He reports current alcohol use. He reports that he does not use drugs.  No Known Allergies  Family History  Problem Relation Age of Onset  . Lung disease Neg Hx      Prior to Admission medications   Medication Sig Start Date End Date Taking? Authorizing Provider  albuterol (PROVENTIL HFA;VENTOLIN HFA) 108 (90 Base) MCG/ACT inhaler Inhale 2 puffs into the lungs every 4 (four) hours as needed for wheezing or shortness of breath. 09/19/16  Yes Orson Slick, MD  Pseudoeph-Doxylamine-DM-APAP (NYQUIL PO) Take 1 Dose by mouth daily as needed (cold symptoms).   Yes [provider]    Physical Exam: Vitals:   06/14/20 0330 06/14/20 0345 06/14/20 0400 06/14/20 0415  BP: 107/75 106/74  112/81  Pulse: 90 93 90 96  Resp: 17 (!) 23 (!) 25 (!) 26  Temp:      TempSrc:      SpO2: 93% 91% 91% 93%    Constitutional: NAD,  calm, comfortable Eyes: PERRL, lids and conjunctivae normal ENMT: Mucous membranes are moist. Posterior pharynx clear of any exudate or lesions.Normal dentition.  Neck: normal, supple, no masses, no thyromegaly Respiratory: Rhonchi and few wheezes Cardiovascular: Regular rate and rhythm, no murmurs / rubs / gallops. No extremity edema. 2+ pedal pulses. No carotid bruits.  Abdomen: no tenderness, no masses palpated. No hepatosplenomegaly. Bowel sounds positive.  Musculoskeletal: no clubbing / cyanosis. No joint deformity upper and lower extremities. Good ROM, no contractures. Normal muscle tone.  Skin: no rashes, lesions, ulcers. No induration Neurologic: CN 2-12 grossly intact. Sensation intact, DTR normal. Strength 5/5 in all 4.  Psychiatric: Normal judgment and insight. Alert and oriented x 3. Normal mood.    Labs on Admission: I have personally reviewed following labs and imaging studies  CBC: Recent Labs  Lab 06/14/20 0133  WBC 15.5*  NEUTROABS 13.9*  HGB 14.0  HCT 40.3  MCV 98.3  PLT 626*   Basic Metabolic Panel: Recent Labs  Lab 06/14/20 0133  NA 129*  K 3.2*  CL 95*  CO2 23  GLUCOSE 138*  BUN 9  CREATININE 1.46*  CALCIUM 8.7*   GFR: CrCl cannot be calculated (Unknown ideal weight.). Liver Function Tests: Recent Labs  Lab 06/14/20 0133  AST 20  ALT 14  ALKPHOS 38  BILITOT 0.6  PROT 7.3  ALBUMIN 2.5*   No results for input(s): LIPASE, AMYLASE in the last 168 hours. No results for input(s): AMMONIA in the last 168 hours. Coagulation Profile: Recent Labs  Lab 06/14/20 0140  INR 1.1   Cardiac Enzymes: No results for input(s): CKTOTAL, CKMB, CKMBINDEX, TROPONINI in the last 168 hours. BNP (last 3 results) No results for input(s): PROBNP in the last 8760 hours. HbA1C: No results for input(s): HGBA1C in the last 72 hours. CBG: No results for input(s): GLUCAP in the last 168 hours. Lipid Profile: No results for input(s): CHOL, HDL, LDLCALC, TRIG,  CHOLHDL, LDLDIRECT in the last 72 hours. Thyroid Function Tests: No results for input(s): TSH, T4TOTAL, FREET4, T3FREE, THYROIDAB in the last 72 hours. Anemia Panel: No results for input(s): VITAMINB12, FOLATE, FERRITIN, TIBC, IRON, RETICCTPCT in the last 72 hours. Urine analysis: No results found for: COLORURINE, APPEARANCEUR, LABSPEC, PHURINE, GLUCOSEU, HGBUR, BILIRUBINUR, KETONESUR, PROTEINUR, UROBILINOGEN, NITRITE, LEUKOCYTESUR  Radiological Exams on Admission: DG Chest Port 1 View  Result Date: 06/14/2020 CLINICAL DATA:  Fever, dyspnea EXAM: PORTABLE CHEST 1 VIEW COMPARISON:  09/19/2016 FINDINGS: There is focal airspace infiltrate within the left upper lobe in keeping with acute lobar pneumonia in the appropriate clinical setting. Mild left basilar atelectasis. Right lung is clear. No pneumothorax or pleural effusion. Cardiac size is within normal limits. Pulmonary vascularity is normal. No acute bone abnormality. IMPRESSION: Left upper lobe focal pneumonic infiltrate. Follow-up chest radiograph is recommended in 3-4 weeks to document complete resolution. Electronically Signed   By: Helyn Numbers MD   On: 06/14/2020 01:52    EKG: Independently reviewed.  Assessment/Plan Principal Problem:   Community acquired pneumonia of left upper lobe of lung Active Problems:   Sepsis (HCC)   AKI (acute kidney injury) (HCC)   Hyponatremia   Acute respiratory failure with hypoxia (HCC)    1. LUL CAP with sepsis and acute hypoxic resp failure - 1. PNA pathway 2. IVF: 1L bolus then LR at 125 3. O2 via [place] 4. Cont pulse ox and tele monitor 5. COVID and flu are neg 6. Rocephin + Azithro 7. BCx 8. Tylenol PRN fever 2. AKI - 1. Mild AKI secondary to #1 above 2. IVF 3. Repeat BMP in AM 3. Asthma - 1. PRN albuterol 2. Adult wheeze protocol 3. Only mild wheezing at this time 4. Will hold off on steroids for the moment, but may want to use these if respiratory status worsens at all.  DVT  prophylaxis: Lovenox Code Status: Full Family Communication: No family in room Disposition Plan: Home after PNA/Sepsis improved and off oxygen Consults called: None Admission status: Admit to inpatient  Severity of Illness: The appropriate patient status for this patient is INPATIENT. Inpatient status is judged to be reasonable and necessary in order to provide the required intensity of service to ensure the patient's safety. The patient's presenting symptoms, physical exam findings, and initial radiographic and laboratory data in the context of their chronic comorbidities is felt to place them at high risk for further clinical deterioration. Furthermore, it is not anticipated that the patient will be medically stable for discharge from the hospital within 2 midnights of admission. The following factors support the patient status of inpatient.   IP status due to: 1) PNA with new O2 requirement 2) sepsis with hypotension 3) PNA with PORT score / PSI of 102.   * I certify that at the point of admission it is my clinical judgment that the patient  will require inpatient hospital care spanning beyond 2 midnights from the point of admission due to high intensity of service, high risk for further deterioration and high frequency of surveillance required.*    Maxamilian Amadon M. DO [facility]  How to contact the [facility] Attending or Consulting provider 7A - 7P or covering provider during after hours 7P -7A, for this patient?  1. Check the care team in [facility] and look for a) attending/consulting [facility] provider listed and b) the [facility] team listed 2. Log into [email or web address]  Amion Physician Scheduling and messaging for groups and whole hospitals  On call and physician scheduling software for group practices, residents, hospitalists and other medical providers for call, clinic, rotation and shift schedules. On[employer] is a hospital-wide system for scheduling doctors and paging doctors on call.  EasyPlot is for scientific plotting and data analysis.  [email or web address]  and use [place]'s universal password to access. If you do not have the password, please contact the hospital operator.  3. Locate the [facility] provider you are looking for under [facility] and page to a number that you can be directly reached. 4. If you still have difficulty reaching the provider, please page the [facility] (Director on Call) for the Hospitalists listed on amion for assistance.  06/14/2020, 4:39 AM

## 2020-06-14 NOTE — ED Notes (Signed)
Report given to Jennifer L, RN  

## 2020-06-14 NOTE — ED Notes (Signed)
Pt had runny bowel movement in bed. Pt cleaned up with full linen change

## 2020-06-14 NOTE — ED Triage Notes (Addendum)
Pt reports flu like symptoms, Pt states he has  Not been feeling good for over a week. Pt present with signs and symptoms of fever, runny nose, SOB, Generalized weakness. Pt reports he is covid vaccinated x 2

## 2020-06-14 NOTE — Progress Notes (Addendum)
Same day note  Patient seen and examined at bedside.  Patient was admitted to the hospital for fever and fatigue and flulike symptoms.  At the time of my evaluation, patient complains of cough shortness of breath and mild throat pain with dark phlegm  Physical examination reveals male lying in bed coughing.  Chest with coarse breath sounds bilaterally.  Wheezes.  On nasal cannula oxygen.  Vitals with BMI 06/14/2020 06/14/2020 06/14/2020  Height - - -  Weight - - -  BMI - - -  Systolic 132 121 297  Diastolic 91 94 95  Pulse 109 108 111    Laboratory data and imaging was reviewed  Assessment and Plan.  Left upper lobe community-acquired pneumonia with sepsis on presentation. Patient had tachycardia, tachypnea, elevated lactate, leukocytosis with pneumonia as a source of infection.  Patient received IV fluids with improvement of lactate to 1.5.  Covid test and flu was negative.  Blood cultures negative in less than 12 hours.Urinalysis was negative.  Continue Rocephin and Zithromax.  Acute hypoxic respiratory failure secondary to pneumonia Patient was hypoxic with pulse ox of 86% on room air.  Improved we will continue to wean  Hypokalemia. Will replenish potassium.  Check levels in a.m.  Mild hyponatremia.  Improved with IV fluid.  We will continue to monitor.  Acute kidney injury Mild elevated creatinine at 1.4.  History of asthma but has history of smoking.  Possibility of underlying COPD undiagnosed. Change albuterol inhaler to nebulizer.  Has inhalers at home.  We will add IV Solu-Medrol due to diffuse wheezing.  Add Pulmicort nebulizer.    Signed,  Tenny Craw, MD [facility]

## 2020-06-14 NOTE — Plan of Care (Signed)
  Problem: Education: Goal: Knowledge of General Education information will improve Description: Including pain rating scale, medication(s)/side effects and non-pharmacologic comfort measures Outcome: Progressing   Problem: Clinical Measurements: Goal: Respiratory complications will improve Outcome: Not Progressing

## 2020-06-14 NOTE — ED Notes (Addendum)
Pt hypotensive. Bp 88/61. Provider made aware. Will continue to monitor the patient at this time.

## 2020-06-14 NOTE — ED Provider Notes (Signed)
[facility] EMERGENCY DEPARTMENT Provider Note   CSN: [id number] Arrival date & time: 06/14/20  0113     History Chief Complaint  Patient presents with  . Fatigue  . Fever    Richard Robinson is a 55 y.o. male.  The history is provided by the patient and medical records.  Fever   55 y.o. M with hx of asthma, bronchitis, presenting to the ED for feeling poorly over the past week.  Patient reports fever, runny nose, sore throat, generalized weakness, fatigue, and cough.  States he does have a lot of congestion in his throat, occasionally coughs it up. No hemoptysis.  No sick contacts.  He is covid vaccinated x2.  He states he does have history of asthma and feels like that has been worse since this started as well.  He is not O2 dependent, uses inhalers for management of asthma.  Past Medical History:  Diagnosis Date  . Asthma   . Broken ribs   . Bronchitis     There are no problems to display for this patient.   No past surgical history on file.     No family history on file.  Social History   Tobacco Use  . Smoking status: Current Every Day Smoker  . Smokeless tobacco: Never Used  Substance Use Topics  . Alcohol use: Yes  . Drug use: No    Home Medications Prior to Admission medications   Medication Sig Start Date End Date Taking? Authorizing Provider  albuterol (PROVENTIL HFA;VENTOLIN HFA) 108 (90 Base) MCG/ACT inhaler Inhale 2 puffs into the lungs every 4 (four) hours as needed for wheezing or shortness of breath. 09/19/16   Orson Slick, MD  azithromycin (ZITHROMAX) 250 MG tablet Take 1 tablet (250 mg total) by mouth daily. Take first 2 tablets together, then 1 every day until finished. Patient not taking: Reported on 09/19/2016 05/21/15   Vanetta Mulders, MD  predniSONE (DELTASONE) 10 MG tablet Take 4 tablets (40 mg total) by mouth daily. Patient not taking: Reported on 09/19/2016 05/21/15   Vanetta Mulders, MD  predniSONE (DELTASONE) 10 MG tablet  Take 4 tablets (40 mg total) by mouth daily. 09/19/16   Orson Slick, MD    Allergies    Patient has no known allergies.  Review of Systems   Review of Systems  Constitutional: Positive for fever.  All other systems reviewed and are negative.   Physical Exam Updated Vital Signs BP 102/63 (BP Location: Right Arm)   Pulse (!) 117   Temp (!) 103.2 F (39.6 C) (Oral)   Resp (!) 22   SpO2 96%   Physical Exam Vitals and nursing note reviewed.  Constitutional:      Appearance: He is well-developed.     Comments: Clammy in appearance, warm to touch  HENT:     Head: Normocephalic and atraumatic.     Mouth/Throat:     Comments: PND noted Eyes:     Conjunctiva/sclera: Conjunctivae normal.     Pupils: Pupils are equal, round, and reactive to light.  Cardiovascular:     Rate and Rhythm: Regular rhythm. Tachycardia present.     Heart sounds: Normal heart sounds.  Pulmonary:     Effort: Pulmonary effort is normal.     Breath sounds: Normal breath sounds.     Comments: Intermixed wheezes/rhonchi noted, NAD, able to speak in sentences, O2 sats >95% on RA Abdominal:     General: Bowel sounds are normal.     Palpations: Abdomen  is soft.  Musculoskeletal:        General: Normal range of motion.     Cervical back: Normal range of motion.  Skin:    General: Skin is warm and dry.  Neurological:     Mental Status: He is alert and oriented to person, place, and time.     ED Results / Procedures / Treatments   Labs (all labs ordered are listed, but only abnormal results are displayed) Labs Reviewed  LACTIC ACID, PLASMA - Abnormal; Notable for the following components:      Result Value   Lactic Acid, Venous 2.1 (*)    All other components within normal limits  COMPREHENSIVE METABOLIC PANEL - Abnormal; Notable for the following components:   Sodium 129 (*)    Potassium 3.2 (*)    Chloride 95 (*)    Glucose, Bld 138 (*)    Creatinine, Ser 1.46 (*)    Calcium 8.7 (*)    Albumin  2.5 (*)    GFR, Estimated 58 (*)    All other components within normal limits  CBC WITH DIFFERENTIAL/PLATELET - Abnormal; Notable for the following components:   WBC 15.5 (*)    RBC 4.10 (*)    MCH 34.1 (*)    Platelets 626 (*)    Neutro Abs 13.9 (*)    Abs Immature Granulocytes 0.24 (*)    All other components within normal limits  RESP PANEL BY RT-PCR (FLU A&B, COVID) ARPGX2  CULTURE, BLOOD (SINGLE)  CULTURE, BLOOD (SINGLE)  LACTIC ACID, PLASMA  PROTIME-INR  URINALYSIS, ROUTINE W REFLEX MICROSCOPIC  HIV ANTIBODY (ROUTINE TESTING W REFLEX)  CBC  BASIC METABOLIC PANEL    EKG None  Radiology DG Chest Port 1 View  Result Date: 06/14/2020 CLINICAL DATA:  Fever, dyspnea EXAM: PORTABLE CHEST 1 VIEW COMPARISON:  09/19/2016 FINDINGS: There is focal airspace infiltrate within the left upper lobe in keeping with acute lobar pneumonia in the appropriate clinical setting. Mild left basilar atelectasis. Right lung is clear. No pneumothorax or pleural effusion. Cardiac size is within normal limits. Pulmonary vascularity is normal. No acute bone abnormality. IMPRESSION: Left upper lobe focal pneumonic infiltrate. Follow-up chest radiograph is recommended in 3-4 weeks to document complete resolution. Electronically Signed   By: Helyn Numbers MD   On: 06/14/2020 01:52    Procedures Procedures   CRITICAL CARE Performed by: Garlon Hatchet   Total critical care time: 35 minutes  Critical care time was exclusive of separately billable procedures and treating other patients.  Critical care was necessary to treat or prevent imminent or life-threatening deterioration.  Critical care was time spent personally by me on the following activities: development of treatment plan with patient and/or surrogate as well as nursing, discussions with consultants, evaluation of patient's response to treatment, examination of patient, obtaining history from patient or surrogate, ordering and performing  treatments and interventions, ordering and review of laboratory studies, ordering and review of radiographic studies, pulse oximetry and re-evaluation of patient's condition.   Medications Ordered in ED Medications  azithromycin (ZITHROMAX) 500 mg in sodium chloride 0.9 % 250 mL IVPB (has no administration in time range)  cefTRIAXone (ROCEPHIN) 1 g in sodium chloride 0.9 % 100 mL IVPB (1 g Intravenous New Bag/Given 06/14/20 0424)  enoxaparin (LOVENOX) injection 40 mg (has no administration in time range)  lactated ringers infusion (has no administration in time range)  cefTRIAXone (ROCEPHIN) 2 g in sodium chloride 0.9 % 100 mL IVPB (has no administration in  time range)  azithromycin (ZITHROMAX) 500 mg in sodium chloride 0.9 % 250 mL IVPB (has no administration in time range)  acetaminophen (TYLENOL) tablet 650 mg (has no administration in time range)  potassium chloride (KLOR-CON) CR tablet 30 mEq (has no administration in time range)  albuterol (VENTOLIN HFA) 108 (90 Base) MCG/ACT inhaler 2 puff (has no administration in time range)  cefTRIAXone (ROCEPHIN) 1 g in sodium chloride 0.9 % 100 mL IVPB (has no administration in time range)  acetaminophen (TYLENOL) tablet 650 mg (650 mg Oral Given 06/14/20 0138)  sodium chloride 0.9 % bolus 1,000 mL (0 mLs Intravenous Stopped 06/14/20 0425)    ED Course  I have reviewed the triage vital signs and the nursing notes.  Pertinent labs & imaging results that were available during my care of the patient were reviewed by me and considered in my medical decision making (see chart for details).    MDM Rules/Calculators/A&P  55 y.o. M here with complaint of feeling poorly for the past week.  He reports cough, fever, body aches, generalized weakness, fatigue.  Denies sick contacts. He is vaccinated for covid-19.  Febrile here, clammy and warm to touch.  He has tachycardic with intermixed wheezes and rhonchi and.  Vitals are stable on room air at present.   Chest x-ray was obtained, left upper lobe infiltrate noted.  Labs are pending.  Will send Covid screen.  He has been given Tylenol for fever, will start IV fluids.  4:01 AM Went to reassess patient, appeared more SOB in appearance.  sats down to 86% with good waveform at rest.  Placed on 2L with quick improvement to mid 90's And appears to be breathing much easier.  covid screen negative.  L;abs as above-- minimally elevated initial lactate at 2.1 but repeat WNL at 1.5.  WBC count 15.5. Will start abx for CAP and admit given new O2 requirement.   Patient agreeable.  Discussed with hospitalist, Dr. Julian Reil-- will admit for ongoing care.  Final Clinical Impression(s) / ED Diagnoses Final diagnoses:  Chest pain    Rx / DC Orders ED Discharge Orders    None       Garlon Hatchet, PA-C 06/14/20 0430    Geoffery Lyons, MD 06/14/20 [phone]

## 2020-06-15 LAB — PHOSPHORUS: Phosphorus: 3 mg/dL (ref 2.5–4.6)

## 2020-06-15 LAB — BASIC METABOLIC PANEL
Anion gap: 10 (ref 5–15)
BUN: 11 mg/dL (ref 6–20)
CO2: 24 mmol/L (ref 22–32)
Calcium: 9 mg/dL (ref 8.9–10.3)
Chloride: 100 mmol/L (ref 98–111)
Creatinine, Ser: 0.96 mg/dL (ref 0.61–1.24)
GFR, Estimated: >60 mL/min (ref >60)
Glucose, Bld: 146 mg/dL — ABNORMAL HIGH (ref 70–99)
Potassium: 3.3 mmol/L — ABNORMAL LOW (ref 3.5–5.1)
Sodium: 134 mmol/L — ABNORMAL LOW (ref 135–145)

## 2020-06-15 LAB — CBC
HCT: 36.4 % — ABNORMAL LOW (ref 39.0–52.0)
Hemoglobin: 12.7 g/dL — ABNORMAL LOW (ref 13.0–17.0)
MCH: 34.3 pg — ABNORMAL HIGH (ref 26.0–34.0)
MCHC: 34.9 g/dL (ref 30.0–36.0)
MCV: 98.4 fL (ref 80.0–100.0)
Platelets: 596 10*3/uL — ABNORMAL HIGH (ref 150–400)
RBC: 3.7 MIL/uL — ABNORMAL LOW (ref 4.22–5.81)
RDW: 12.7 % (ref 11.5–15.5)
WBC: 16.2 10*3/uL — ABNORMAL HIGH (ref 4.0–10.5)
nRBC: 0 % (ref 0.0–0.2)

## 2020-06-15 LAB — MAGNESIUM: Magnesium: 1.9 mg/dL (ref 1.7–2.4)

## 2020-06-15 MED ORDER — MAGNESIUM OXIDE 400 (241.3 MG) MG PO TABS
400.0000 mg | ORAL_TABLET | Freq: Two times a day (BID) | ORAL | Status: DC
  Administered 2020-06-15 – 2020-06-18 (×7): 400 mg via ORAL
  Filled 2020-06-15 (×7): qty 1

## 2020-06-15 MED ORDER — POTASSIUM CHLORIDE CRYS ER 20 MEQ PO TBCR
40.0000 meq | EXTENDED_RELEASE_TABLET | Freq: Once | ORAL | Status: AC
  Administered 2020-06-15: 40 meq via ORAL
  Filled 2020-06-15: qty 2

## 2020-06-15 MED ORDER — FUROSEMIDE 10 MG/ML IJ SOLN
40.0000 mg | Freq: Once | INTRAMUSCULAR | Status: AC
  Administered 2020-06-15: 40 mg via INTRAVENOUS
  Filled 2020-06-15: qty 4

## 2020-06-15 NOTE — Progress Notes (Signed)
PROGRESS NOTE  Shailen Thielen XBD:[id number] DOB: 04-Jul-1965 DOA: 06/14/2020 PCP: Patient, No Pcp Per (Inactive)   LOS: 1 day   Brief narrative: Patient is a 55 years old male with history of smoking, asthma presented to hospital with fever cough, runny nose, sore throat, generalized weakness and shortness of breath.  Patient does not use oxygen at baseline and has inhalers at home for asthma.  In the ED, patient was noted to have left upper lobe pneumonia on the chest x-ray and was noted to febrile with a temperature of 103 F and blood pressure was low.  Patient received 1 L of normal saline bolus and needed 2 L of oxygen in the ED.  Patient was hypoxic with a pulse ox of 86% on room air in the ED.  He was also noted to have sodium of 129 and creatinine was elevated from his baseline.  Patient was then admitted hospital for further evaluation and treatment.   Assessment/Plan:  Principal Problem:   Community acquired pneumonia of left upper lobe of lung Active Problems:   Sepsis (HCC)   AKI (acute kidney injury) (HCC)   Hyponatremia   Acute respiratory failure with hypoxia (HCC)  Left upper lobe community-acquired pneumonia with sepsis on presentation. Patient had tachycardia, tachypnea, elevated lactate, leukocytosis with pneumonia as a source of infection.  Patient received IV fluids with improvement of lactate to 1.5.  Covid test and flu was negative.    Blood cultures negative less than 12 hours.  Currently on Rocephin and Zithromax.  Will requiring up to 6 L of oxygen by nasal cannula.  Acute hypoxic respiratory failure secondary to pneumonia Patient was hypoxic with pulse ox of 86% on room air.    Requiring 6 L of oxygen by nasal cannula.  We will continue to wean as able.  Patient was initiated on steroids as well due to some wheezing and possible component of COPD.  We will give her 1 dose of IV Lasix today since patient received a lot of fluid.  Hypokalemia. ~Mildly hypokalemic.   Will replenish orally.  Check levels in a.m.  Mild hyponatremia.  Initially improved with IV fluids.  Sodium of 134 today.  Will give 1 dose of Lasix today due to increasing oxygen demand.  Check BMP in AM.  Acute kidney injury Improved after IV fluids.  Will closely monitor.  Check BMP in AM.  Will receive 1 unit dose of Lasix.  Will discontinue IV fluids.  History of asthma, history of smoking.  Possibility of underlying COPD undiagnosed. Continue nebulizers, IV Solu-Medrol, Pulmicort nebulizer.  Continue to wean oxygen as able.  Continue IV antibiotics.   DVT prophylaxis: enoxaparin (LOVENOX) injection 40 mg Start: 06/14/20 1600   Code Status: Full code  Family Communication: None  Status is: Inpatient  Remains inpatient appropriate because:IV treatments appropriate due to intensity of illness or inability to take PO and Inpatient level of care appropriate due to severity of illness   Dispo: The patient is from: Home              Anticipated d/c is to: Home              Patient currently is not medically stable to d/c.    Barriers to discharge : patient with hypoxic respiratory failure on supplemental oxygen, symptomatic pneumonia requiring IV antibiotic.   Difficult to place patient No  Consultants:  None  Procedures:  None  Anti-infectives:  Marland Kitchen Rocephin and Zithromax 3/31>  Anti-infectives (From  admission, onward)   Start     Dose/Rate Route Frequency Ordered Stop   06/15/20 0500  azithromycin (ZITHROMAX) 500 mg in sodium chloride 0.9 % 250 mL IVPB        500 mg 250 mL/hr over 60 Minutes Intravenous Every 24 hours 06/14/20 0418 06/20/20 0459   06/15/20 0400  cefTRIAXone (ROCEPHIN) 2 g in sodium chloride 0.9 % 100 mL IVPB        2 g 200 mL/hr over 30 Minutes Intravenous Every 24 hours 06/14/20 0418 06/20/20 0359   06/14/20 0430  cefTRIAXone (ROCEPHIN) 1 g in sodium chloride 0.9 % 100 mL IVPB        1 g 200 mL/hr over 30 Minutes Intravenous  Once 06/14/20 0427  06/14/20 0540   06/14/20 0415  azithromycin (ZITHROMAX) 500 mg in sodium chloride 0.9 % 250 mL IVPB        500 mg 250 mL/hr over 60 Minutes Intravenous  Once 06/14/20 0403 06/14/20 0723   06/14/20 0415  cefTRIAXone (ROCEPHIN) 1 g in sodium chloride 0.9 % 100 mL IVPB        1 g 200 mL/hr over 30 Minutes Intravenous  Once 06/14/20 0403 06/14/20 0457     Subjective: Today, patient was seen and examined at bedside.  Complains of cough fever shortness of breath but a little better with shortness of breath today.  Continues to have productive sputum.  Has had bowel movement, no nausea vomiting.  On 6 L of oxygen by nasal cannula.  Objective: Vitals:   06/15/20 0922 06/15/20 0948  BP:  115/78  Pulse:  80  Resp:  18  Temp:  98.4 F (36.9 C)  SpO2: 93% 95%    Intake/Output Summary (Last 24 hours) at 06/15/2020 1151 Last data filed at 06/15/2020 0900 Gross per 24 hour  Intake 3636.52 ml  Output 250 ml  Net 3386.52 ml   There were no vitals filed for this visit. Body mass index is 22.81 kg/m.   Physical Exam: GENERAL: Patient is alert awake and oriented.  Communicative, on supplemental Oxygen, intermittently coughing, HENT: No scleral pallor or icterus. Pupils equally reactive to light. Oral mucosa is moist NECK: is supple, no gross swelling noted. CHEST:   Diminished breath sounds bilaterally.  Coarse breath sounds noted CVS: S1 and S2 heard, no murmur. Regular rate and rhythm.  ABDOMEN: Soft, non-tender, bowel sounds are present. EXTREMITIES: No edema. CNS: Cranial nerves are intact. No focal motor deficits. SKIN: warm and dry without rashes.  Data Review: I have personally reviewed the following laboratory data and studies,  CBC: Recent Labs  Lab 06/14/20 0133 06/14/20 0640 06/15/20 0350  WBC 15.5* 15.1* 16.2*  NEUTROABS 13.9*  --   --   HGB 14.0 12.9* 12.7*  HCT 40.3 37.8* 36.4*  MCV 98.3 99.2 98.4  PLT 626* 583* 596*   Basic Metabolic Panel: Recent Labs  Lab  06/14/20 0133 06/14/20 0640 06/15/20 0350  NA 129* 132* 134*  K 3.2* 3.1* 3.3*  CL 95* 98 100  CO2 23 23 24   GLUCOSE 138* 122* 146*  BUN 9 10 11   CREATININE 1.46* 1.23 0.96  CALCIUM 8.7* 8.3* 9.0  MG  --   --  1.9  PHOS  --   --  3.0   Liver Function Tests: Recent Labs  Lab 06/14/20 0133  AST 20  ALT 14  ALKPHOS 38  BILITOT 0.6  PROT 7.3  ALBUMIN 2.5*   No results for input(s): LIPASE, AMYLASE  in the last 168 hours. No results for input(s): AMMONIA in the last 168 hours. Cardiac Enzymes: No results for input(s): CKTOTAL, CKMB, CKMBINDEX, TROPONINI in the last 168 hours. BNP (last 3 results) No results for input(s): BNP in the last 8760 hours.  ProBNP (last 3 results) No results for input(s): PROBNP in the last 8760 hours.  CBG: No results for input(s): GLUCAP in the last 168 hours. Recent Results (from the past 240 hour(s))  Culture, blood (single)     Status: None (Preliminary result)   Collection Time: 06/14/20  1:33 AM   Specimen: BLOOD  Result Value Ref Range Status   Specimen Description BLOOD RIGHT ARM  Final   Special Requests   Final    BOTTLES DRAWN AEROBIC AND ANAEROBIC Blood Culture adequate volume   Culture   Final    NO GROWTH < 12 HOURS Performed at [facility], [address] [place], [place] [address]    Report Status PENDING  Incomplete  Culture, blood (single)     Status: None (Preliminary result)   Collection Time: 06/14/20  1:42 AM   Specimen: BLOOD RIGHT HAND  Result Value Ref Range Status   Specimen Description BLOOD RIGHT HAND  Final   Special Requests   Final    BOTTLES DRAWN AEROBIC ONLY Blood Culture adequate volume   Culture   Final    NO GROWTH < 12 HOURS Performed at [facility], [address] [place], [place] [address]    Report Status PENDING  Incomplete  Resp Panel by RT-PCR (Flu A&B, Covid) Nasopharyngeal Swab     Status: None   Collection Time: 06/14/20  2:39 AM   Specimen: Nasopharyngeal Swab;  Nasopharyngeal(NP) swabs in vial transport medium  Result Value Ref Range Status   SARS Coronavirus 2 by RT PCR NEGATIVE NEGATIVE Final    Comment: (NOTE) SARS-CoV-2 target nucleic acids are NOT DETECTED.  The SARS-CoV-2 RNA is generally detectable in upper respiratory specimens during the acute phase of infection. The lowest concentration of SARS-CoV-2 viral copies this assay can detect is 138 copies/mL. A negative result does not preclude SARS-Cov-2 infection and should not be used as the sole basis for treatment or other patient management decisions. A negative result may occur with  improper specimen collection/handling, submission of specimen other than nasopharyngeal swab, presence of viral mutation(s) within the areas targeted by this assay, and inadequate number of viral copies(<138 copies/mL). A negative result must be combined with clinical observations, patient history, and epidemiological information. The expected result is Negative.  Fact Sheet for Patients:  BloggerCourse.comhttps://www.fda.gov/media/152166/download  Fact Sheet for Healthcare Providers:  SeriousBroker.ithttps://www.fda.gov/media/152162/download  This test is no t yet approved or cleared by the [place] FDA and  has been authorized for detection and/or diagnosis of SARS-CoV-2 by FDA under an Emergency Use Authorization (EUA). This EUA will remain  in effect (meaning this test can be used) for the duration of the COVID-19 declaration under Section 564(b)(1) of the Act, 21 U.S.C.section 360bbb-3(b)(1), unless the authorization is terminated  or revoked sooner.       Influenza A by PCR NEGATIVE NEGATIVE Final   Influenza B by PCR NEGATIVE NEGATIVE Final    Comment: (NOTE) The Xpert Xpress SARS-CoV-2/FLU/RSV plus assay is intended as an aid in the diagnosis of influenza from Nasopharyngeal swab specimens and should not be used as a sole basis for treatment. Nasal washings and aspirates are unacceptable for Xpert Xpress  SARS-CoV-2/FLU/RSV testing.  Fact Sheet for  Patients: BloggerCourse.com  Fact Sheet for Healthcare Providers: SeriousBroker.it  This test is not yet approved or cleared by the [place] FDA and has been authorized for detection and/or diagnosis of SARS-CoV-2 by FDA under an Emergency Use Authorization (EUA). This EUA will remain in effect (meaning this test can be used) for the duration of the COVID-19 declaration under Section 564(b)(1) of the Act, 21 U.S.C. section 360bbb-3(b)(1), unless the authorization is terminated or revoked.  Performed at [facility], [address] [place], [place] [address]      Studies: DG Chest Port 1 View  Result Date: 06/14/2020 CLINICAL DATA:  Fever, dyspnea EXAM: PORTABLE CHEST 1 VIEW COMPARISON:  09/19/2016 FINDINGS: There is focal airspace infiltrate within the left upper lobe in keeping with acute lobar pneumonia in the appropriate clinical setting. Mild left basilar atelectasis. Right lung is clear. No pneumothorax or pleural effusion. Cardiac size is within normal limits. Pulmonary vascularity is normal. No acute bone abnormality. IMPRESSION: Left upper lobe focal pneumonic infiltrate. Follow-up chest radiograph is recommended in 3-4 weeks to document complete resolution. Electronically Signed   By: Helyn Numbers MD   On: 06/14/2020 01:52      Joycelyn Das, MD  [facility] 06/15/2020  If 7PM-7AM, please contact night-coverage

## 2020-06-15 NOTE — Progress Notes (Signed)
RT called to assess patient.  RT arrived patient satting 88% on 5L.  RT placed patient on 7L salter sats now 93%.  Lung sounds diminished.  Congested cough noted.  Patient is not in distress and states he does not feel short of breath.  RN gave prn breathing tx prior to RT arrival.  RT will continue to monitor.

## 2020-06-15 NOTE — Progress Notes (Signed)
Administered prn Albuterol to patient due to difficulty breathing and decreased sats, upon assessment. Lungs sounds with Rhonchi and diminished. Patient's sats still in the 80s post treatment. RT notified. Will continue to monitor.   Donia Guiles, RN.

## 2020-06-16 LAB — CBC
HCT: 36.2 % — ABNORMAL LOW (ref 39.0–52.0)
Hemoglobin: 12.3 g/dL — ABNORMAL LOW (ref 13.0–17.0)
MCH: 33.9 pg (ref 26.0–34.0)
MCHC: 34 g/dL (ref 30.0–36.0)
MCV: 99.7 fL (ref 80.0–100.0)
Platelets: 632 10*3/uL — ABNORMAL HIGH (ref 150–400)
RBC: 3.63 MIL/uL — ABNORMAL LOW (ref 4.22–5.81)
RDW: 12.7 % (ref 11.5–15.5)
WBC: 19.7 10*3/uL — ABNORMAL HIGH (ref 4.0–10.5)
nRBC: 0 % (ref 0.0–0.2)

## 2020-06-16 LAB — BASIC METABOLIC PANEL
Anion gap: 9 (ref 5–15)
BUN: 17 mg/dL (ref 6–20)
CO2: 24 mmol/L (ref 22–32)
Calcium: 9.1 mg/dL (ref 8.9–10.3)
Chloride: 101 mmol/L (ref 98–111)
Creatinine, Ser: 0.84 mg/dL (ref 0.61–1.24)
GFR, Estimated: >60 mL/min (ref >60)
Glucose, Bld: 145 mg/dL — ABNORMAL HIGH (ref 70–99)
Potassium: 3.7 mmol/L (ref 3.5–5.1)
Sodium: 134 mmol/L — ABNORMAL LOW (ref 135–145)

## 2020-06-16 LAB — MAGNESIUM: Magnesium: 2.3 mg/dL (ref 1.7–2.4)

## 2020-06-16 NOTE — Progress Notes (Signed)
Patient O2 sats is between 80s-91 on room air while sleeping. Patient placed on 2L of oxygen at this time and sats at 95% now. Will continue to monitor.   Donia Guiles, RN.

## 2020-06-16 NOTE — Plan of Care (Signed)
  Problem: Education: Goal: Knowledge of General Education information will improve Description Including pain rating scale, medication(s)/side effects and non-pharmacologic comfort measures Outcome: Progressing   

## 2020-06-16 NOTE — Progress Notes (Signed)
PROGRESS NOTE  Richard Robinson TSV:[id number] DOB: 1965-04-08 DOA: 06/14/2020 PCP: Patient, No Pcp Per (Inactive)  HPI/Recap of past 24 hours: Patient is a 55 years old male with history of smoking, asthma presented to hospital with fever cough, runny nose, sore throat, generalized weakness and shortness of breath.  Patient does not use oxygen at baseline and has inhalers at home for asthma.  In the ED, patient was noted to have left upper lobe pneumonia on the chest x-ray and was noted to febrile with a temperature of 103 F and blood pressure was low.  Patient received 1 L of normal saline bolus and needed 2 L of oxygen in the ED.  Patient was hypoxic with a pulse ox of 86% on room air in the ED.  He was also noted to have sodium of 129 and creatinine was elevated from his baseline.  Patient was then admitted hospital for further evaluation and treatment.  06/16/20: Seen and examined at his bedside.  States he feels better this morning.  Cough is improved.  He is on 3 L O2 saturation of 91%.  Assessment/Plan: Principal Problem:   Community acquired pneumonia of left upper lobe of lung Active Problems:   Sepsis (HCC)   AKI (acute kidney injury) (HCC)   Hyponatremia   Acute respiratory failure with hypoxia (HCC)  Left upper lobe community-acquired pneumonia with sepsisonpresentation. Patient had tachycardia, tachypnea, elevated lactate, leukocytosiswith pneumonia as a source of infection. Patient received IV fluids with improvement of lactate to 1.5. Covid test and flu was negative.   Blood cultures negative less than 12 hours.  Currently on Rocephin and Zithromax.  Will requiring up to 6 L of oxygen by nasal cannula.  Acute hypoxic respiratory failure secondary to pneumonia Patient was hypoxic with pulse ox of 86% on room air.   Requiring 6 L of oxygen by nasal cannula.  We will continue to wean as able.  Patient was initiated on steroids as well due to some wheezing and possible  component of COPD.  We will give her 1 dose of IV Lasix today since patient received a lot of fluid. Currently on 3 L O2 saturation 91% Continue to wean off as tolerated.  Resolved post repletion: Hypokalemia. ~Mildly hypokalemic.  Will replenish orally.  Check levels in a.m.  Mild hyponatremia.  Initially improved with IV fluids.  Sodium of 134 today.  Will give 1 dose of Lasix today due to increasing oxygen demand.  Check BMP in AM.  Resolved acute kidney injury Improved after IV fluids.  Will closely monitor.  Check BMP in AM.  Will receive 1 unit dose of Lasix.  Will discontinue IV fluids. Creatinine is back to baseline.  History of asthma, history of smoking.Possibility of underlying COPD undiagnosed. Continue nebulizers, IV Solu-Medrol, Pulmicort nebulizer.  Continue to wean oxygen as able.  Continue IV antibiotics.   DVT prophylaxis: enoxaparin (LOVENOX) injection 40 mg Start: 06/14/20 1600  Code Status: Full code  Family Communication: None  Status is: Inpatient  Remains inpatient appropriate because:IV treatments appropriate due to intensity of illness or inability to take PO and Inpatient level of care appropriate due to severity of illness   Dispo: The patient is from: Home  Anticipated d/c is to: Home  Patient currently is not medically stable to d/c.               Barriers to discharge : patient with hypoxic respiratory failure on supplemental oxygen, symptomatic pneumonia requiring IV antibiotic.  Difficult to place patient No  Consultants:  None  Procedures:  None  Anti-infectives:   Rocephin and Zithromax 3/31>             Anti-infectives (From admission, onward)           Objective: Vitals:   06/15/20 1700 06/15/20 2034 06/16/20 0430 06/16/20 0800  BP: 114/77 (!) 124/93 (!) 137/99 119/90  Pulse: 80 79 73 75  Resp: 18 17 18 17   Temp: 98.5 F (36.9 C) 98.1 F (36.7 C) 98.2 F (36.8  C) 98.7 F (37.1 C)  TempSrc: Oral Oral Oral Oral  SpO2: 96% 92% 94% 94%  Height:        Intake/Output Summary (Last 24 hours) at 06/16/2020 1807 Last data filed at 06/16/2020 0700 Gross per 24 hour  Intake 704.32 ml  Output 350 ml  Net 354.32 ml   There were no vitals filed for this visit.  Exam:  . General: 55 y.o. year-old male well developed well nourished in no acute distress.  Alert and oriented x3. . Cardiovascular: Regular rate and rhythm with no rubs or gallops.  No thyromegaly or JVD noted.   44 Respiratory: Clear to auscultation with no wheezes or rales. Good inspiratory effort. . Abdomen: Soft nontender nondistended with normal bowel sounds x4 quadrants. . Musculoskeletal: No lower extremity edema. 2/4 pulses in all 4 extremities. . Skin: No ulcerative lesions noted or rashes, . Psychiatry: Mood is appropriate for condition and setting   Data Reviewed: CBC: Recent Labs  Lab 06/14/20 0133 06/14/20 0640 06/15/20 0350 06/16/20 0211  WBC 15.5* 15.1* 16.2* 19.7*  NEUTROABS 13.9*  --   --   --   HGB 14.0 12.9* 12.7* 12.3*  HCT 40.3 37.8* 36.4* 36.2*  MCV 98.3 99.2 98.4 99.7  PLT 626* 583* 596* 632*   Basic Metabolic Panel: Recent Labs  Lab 06/14/20 0133 06/14/20 0640 06/15/20 0350 06/16/20 0211  NA 129* 132* 134* 134*  K 3.2* 3.1* 3.3* 3.7  CL 95* 98 100 101  CO2 23 23 24 24   GLUCOSE 138* 122* 146* 145*  BUN 9 10 11 17   CREATININE 1.46* 1.23 0.96 0.84  CALCIUM 8.7* 8.3* 9.0 9.1  MG  --   --  1.9 2.3  PHOS  --   --  3.0  --    GFR: CrCl cannot be calculated (Unknown ideal weight.). Liver Function Tests: Recent Labs  Lab 06/14/20 0133  AST 20  ALT 14  ALKPHOS 38  BILITOT 0.6  PROT 7.3  ALBUMIN 2.5*   No results for input(s): LIPASE, AMYLASE in the last 168 hours. No results for input(s): AMMONIA in the last 168 hours. Coagulation Profile: Recent Labs  Lab 06/14/20 0140  INR 1.1   Cardiac Enzymes: No results for input(s): CKTOTAL, CKMB,  CKMBINDEX, TROPONINI in the last 168 hours. BNP (last 3 results) No results for input(s): PROBNP in the last 8760 hours. HbA1C: No results for input(s): HGBA1C in the last 72 hours. CBG: No results for input(s): GLUCAP in the last 168 hours. Lipid Profile: No results for input(s): CHOL, HDL, LDLCALC, TRIG, CHOLHDL, LDLDIRECT in the last 72 hours. Thyroid Function Tests: No results for input(s): TSH, T4TOTAL, FREET4, T3FREE, THYROIDAB in the last 72 hours. Anemia Panel: No results for input(s): VITAMINB12, FOLATE, FERRITIN, TIBC, IRON, RETICCTPCT in the last 72 hours. Urine analysis:    Component Value Date/Time   COLORURINE YELLOW 06/14/2020 0640   APPEARANCEUR HAZY (A) 06/14/2020 0640   LABSPEC 1.014  06/14/2020 0640   PHURINE 5.0 06/14/2020 0640   GLUCOSEU NEGATIVE 06/14/2020 0640   HGBUR NEGATIVE 06/14/2020 0640   BILIRUBINUR NEGATIVE 06/14/2020 0640   KETONESUR NEGATIVE 06/14/2020 0640   PROTEINUR NEGATIVE 06/14/2020 0640   NITRITE NEGATIVE 06/14/2020 0640   LEUKOCYTESUR NEGATIVE 06/14/2020 0640   Sepsis Labs: @LABRCNTIP (procalcitonin:4,lacticidven:4)  ) Recent Results (from the past 240 hour(s))  Culture, blood (single)     Status: None (Preliminary result)   Collection Time: 06/14/20  1:33 AM   Specimen: BLOOD  Result Value Ref Range Status   Specimen Description BLOOD RIGHT ARM  Final   Special Requests   Final    BOTTLES DRAWN AEROBIC AND ANAEROBIC Blood Culture adequate volume   Culture   Final    NO GROWTH 2 DAYS Performed at [facility], [address] [place], [place]    Report Status PENDING  Incomplete  Culture, blood (single)     Status: None (Preliminary result)   Collection Time: 06/14/20  1:42 AM   Specimen: BLOOD RIGHT HAND  Result Value Ref Range Status   Specimen Description BLOOD RIGHT HAND  Final   Special Requests   Final    BOTTLES DRAWN AEROBIC ONLY Blood Culture adequate volume   Culture   Final    NO GROWTH 2  DAYS Performed at [facility], [address], [place], [place]    Report Status PENDING  Incomplete  Resp Panel by RT-PCR (Flu A&B, Covid) Nasopharyngeal Swab     Status: None   Collection Time: 06/14/20  2:39 AM   Specimen: Nasopharyngeal Swab; Nasopharyngeal(NP) swabs in vial transport medium  Result Value Ref Range Status   SARS Coronavirus 2 by RT PCR NEGATIVE NEGATIVE Final    Comment: (NOTE) SARS-CoV-2 target nucleic acids are NOT DETECTED.  The SARS-CoV-2 RNA is generally detectable in upper respiratory specimens during the acute phase of infection. The lowest concentration of SARS-CoV-2 viral copies this assay can detect is 138 copies/mL. A negative result does not preclude SARS-Cov-2 infection and should not be used as the sole basis for treatment or other patient management decisions. A negative result may occur with  improper specimen collection/handling, submission of specimen other than nasopharyngeal swab, presence of viral mutation(s) within the areas targeted by this assay, and inadequate number of viral copies(<138 copies/mL). A negative result must be combined with clinical observations, patient history, and epidemiological information. The expected result is Negative.  Fact Sheet for Patients:  06/16/20  Fact Sheet for Healthcare Providers:  BloggerCourse.com  This test is no t yet approved or cleared by the [place]ious[place]ker.[place] FDA and  has been authorized for detection and/or diagnosis of SARS-CoV-2 by FDA under an Emergency Use Authorization (EUA). This EUA will remain  in effect (meaning this test can be used) for the duration of the COVID-19 declaration under Section 564(b)(1) of the Act, 21 U.S.C.section 360bbb-3(b)(1), unless the authorization is terminated  or revoked sooner.       Influenza A by PCR NEGATIVE NEGATIVE Final   Influenza B by PCR NEGATIVE NEGATIVE Final    Comment:  (NOTE) The Xpert Xpress SARS-CoV-2/FLU/RSV plus assay is intended as an aid in the diagnosis of influenza from Nasopharyngeal swab specimens and should not be used as a sole basis for treatment. Nasal washings and aspirates are unacceptable for Xpert Xpress SARS-CoV-2/FLU/RSV testing.  Fact Sheet for Patients: [place]  Fact Sheet for Healthcare Providers: BloggerCourse.com  This test is not yet approved  or cleared by the [place] FDA and has been authorized for detection and/or diagnosis of SARS-CoV-2 by FDA under an Emergency Use Authorization (EUA). This EUA will remain in effect (meaning this test can be used) for the duration of the COVID-19 declaration under Section 564(b)(1) of the Act, 21 U.S.C. section 360bbb-3(b)(1), unless the authorization is terminated or revoked.  Performed at [facility], [address] [place], [place] [address]       Studies: No results found.  Scheduled Meds: . enoxaparin (LOVENOX) injection  40 mg Subcutaneous Q24H  . magnesium oxide  400 mg Oral BID  . methylPREDNISolone (SOLU-MEDROL) injection  40 mg Intravenous Q12H  . pantoprazole  40 mg Oral Daily    Continuous Infusions: . azithromycin 500 mg (06/16/20 0505)  . cefTRIAXone (ROCEPHIN)  IV 2 g (06/16/20 0407)     LOS: 2 days     Darlin Droparole N Clarrisa Kaylor, MD [facility] Pager [phone]  If 7PM-7AM, please contact night-coverage [email or web address] Password [email or web address] 06/16/2020, 6:07 PM

## 2020-06-17 LAB — EXPECTORATED SPUTUM ASSESSMENT W GRAM STAIN, RFLX TO RESP C: Special Requests: NORMAL

## 2020-06-17 LAB — CBC
HCT: 35.3 % — ABNORMAL LOW (ref 39.0–52.0)
Hemoglobin: 12 g/dL — ABNORMAL LOW (ref 13.0–17.0)
MCH: 33.2 pg (ref 26.0–34.0)
MCHC: 34 g/dL (ref 30.0–36.0)
MCV: 97.8 fL (ref 80.0–100.0)
Platelets: 641 10*3/uL — ABNORMAL HIGH (ref 150–400)
RBC: 3.61 MIL/uL — ABNORMAL LOW (ref 4.22–5.81)
RDW: 12.7 % (ref 11.5–15.5)
WBC: 12.8 10*3/uL — ABNORMAL HIGH (ref 4.0–10.5)
nRBC: 0 % (ref 0.0–0.2)

## 2020-06-17 LAB — PROCALCITONIN: Procalcitonin: 1.12 ng/mL

## 2020-06-17 NOTE — Progress Notes (Signed)
SATURATION QUALIFICATIONS: (This note is used to comply with regulatory documentation for home oxygen)  Patient Saturations on Room Air at Rest =96%  Patient Saturations on Room Air while Ambulating = 92% to 97%  Patient needed no oxygen while walking.

## 2020-06-17 NOTE — Plan of Care (Signed)
  Problem: Health Behavior/Discharge Planning: Goal: Ability to manage health-related needs will improve Outcome: Progressing   

## 2020-06-17 NOTE — Progress Notes (Signed)
PROGRESS NOTE  Richard Robinson PJK:[id number] DOB: [birth year] DOA: 06/14/2020 PCP: Patient, No Pcp Per (Inactive)  HPI/Recap of past 24 hours: Patient is a 55 years old male with history of smoking, asthma presented to hospital with fever cough, runny nose, sore throat, generalized weakness and shortness of breath.  Patient does not use oxygen at baseline and has inhalers at home for asthma.  In the ED, patient was noted to have left upper lobe pneumonia on the chest x-ray and was noted to febrile with a temperature of 103 F and blood pressure was low.  Patient received 1 L of normal saline bolus and needed 2 L of oxygen in the ED.  Patient was hypoxic with a pulse ox of 86% on room air in the ED.  He was also noted to have sodium of 129 and creatinine was elevated from his baseline.  Patient was then admitted hospital for further evaluation and treatment.  06/17/20: Seen and examined at his bedside.  Feels better.  Persistent productive cough.  Patient has history of asthma not followed by pulmonary.  Recommend pulmonary follow-up outpatient after discharge.  Procalcitonin elevated this morning greater than 1, leukocytosis greater than 12.  We will continue IV antibiotics today and likely switch to oral tomorrow for DC planning.  Assessment/Plan: Principal Problem:   Community acquired pneumonia of left upper lobe of lung Active Problems:   Sepsis (HCC)   AKI (acute kidney injury) (HCC)   Hyponatremia   Acute respiratory failure with hypoxia (HCC)  Left upper lobe community-acquired pneumonia with sepsis Onpresentation.,  Patient had tachycardia, tachypnea, elevated lactate, leukocytosiswith pneumonia as a source of infection. Patient received IV fluids with improvement of lactate to 1.5. Covid test and flu was negative.   Blood cultures negative to date.   Currently on Rocephin and Zithromax.   Initially required 6 L to maintain O2 saturation greater than 90% Currently off oxygen  supplementation.   Procalcitonin greater than 1, WBC greater than 12. Continue IV antibiotics for now Likely will switch to oral antibiotics on 06/18/2020 for DC planning Repeat CBC and procalcitonin in the morning.  Resolved acute hypoxic respiratory failure secondary to pneumonia Bilateral cerebral.  Resolved post repletion: Hypokalemia. Serum potassium 3.7 Serum magnesium 2.3  Mild hyponatremia.  Initially improved with IV fluids.  Sodium of 134 today.  Will give 1 dose of Lasix today due to increasing oxygen demand.  Check BMP in AM.  Resolved acute kidney injury Improved after IV fluids.  Will closely monitor.   Creatinine is back to baseline  History of asthma, history of smoking.Possibility of underlying COPD undiagnosed. Continue bronchodilators Continue IV antibiotics Will need to follow-up with pulmonary outpatient Tobacco cessation counseling   DVT prophylaxis: enoxaparin (LOVENOX) injection 40 mg Start: 06/14/20 1600  Code Status: Full code  Family Communication: None  Status is: Inpatient  Remains inpatient appropriate because:IV treatments appropriate due to intensity of illness or inability to take PO and Inpatient level of care appropriate due to severity of illness   Dispo: The patient is from: Home  Anticipated d/c is to: Home likely on 06/18/2020  Patient currently is not medically stable to d/c.               Barriers to discharge : patient with hypoxic respiratory failure on supplemental oxygen, symptomatic pneumonia requiring IV antibiotic.              Difficult to place patient No  Consultants:  None  Procedures:  None  Anti-infectives:   Rocephin and Zithromax 3/31>             Anti-infectives (From admission, onward)           Objective: Vitals:   06/16/20 1807 06/16/20 2057 06/17/20 0513 06/17/20 0926  BP: 111/79 120/88 (!) 130/95 (!) 137/99  Pulse: 76 64 (!) 56 85  Resp: 18 18 17  18   Temp: (!) 97.2 F (36.2 C) (!) 97.4 F (36.3 C) 97.8 F (36.6 C) 98.7 F (37.1 C)  TempSrc: Oral Oral Oral Oral  SpO2: 94% 97% 94% 94%  Height:        Intake/Output Summary (Last 24 hours) at 06/17/2020 1737 Last data filed at 06/17/2020 1400 Gross per 24 hour  Intake 590.03 ml  Output 700 ml  Net -109.97 ml   There were no vitals filed for this visit.  Exam:  . General: 56 y.o. year-old male well-developed well-nourished no acute stress.  He is alert and oriented x3.   . Cardiovascular: Regular rate and rhythm no rubs or gallops. 44 Respiratory: Mild rales at bases no wheezing noted.  Good inspiratory effort.  Abdomen: Soft nontender normal bowel sounds present.   . Musculoskeletal: No lower extremity edema bilaterally. . Skin: No ulcerative lesions noted. Marland Kitchen Psychiatry: Mood is appropriate for condition setting.   Data Reviewed: CBC: Recent Labs  Lab 06/14/20 0133 06/14/20 0640 06/15/20 0350 06/16/20 0211 06/17/20 0604  WBC 15.5* 15.1* 16.2* 19.7* 12.8*  NEUTROABS 13.9*  --   --   --   --   HGB 14.0 12.9* 12.7* 12.3* 12.0*  HCT 40.3 37.8* 36.4* 36.2* 35.3*  MCV 98.3 99.2 98.4 99.7 97.8  PLT 626* 583* 596* 632* 641*   Basic Metabolic Panel: Recent Labs  Lab 06/14/20 0133 06/14/20 0640 06/15/20 0350 06/16/20 0211  NA 129* 132* 134* 134*  K 3.2* 3.1* 3.3* 3.7  CL 95* 98 100 101  CO2 23 23 24 24   GLUCOSE 138* 122* 146* 145*  BUN 9 10 11 17   CREATININE 1.46* 1.23 0.96 0.84  CALCIUM 8.7* 8.3* 9.0 9.1  MG  --   --  1.9 2.3  PHOS  --   --  3.0  --    GFR: CrCl cannot be calculated (Unknown ideal weight.). Liver Function Tests: Recent Labs  Lab 06/14/20 0133  AST 20  ALT 14  ALKPHOS 38  BILITOT 0.6  PROT 7.3  ALBUMIN 2.5*   No results for input(s): LIPASE, AMYLASE in the last 168 hours. No results for input(s): AMMONIA in the last 168 hours. Coagulation Profile: Recent Labs  Lab 06/14/20 0140  INR 1.1   Cardiac Enzymes: No results for  input(s): CKTOTAL, CKMB, CKMBINDEX, TROPONINI in the last 168 hours. BNP (last 3 results) No results for input(s): PROBNP in the last 8760 hours. HbA1C: No results for input(s): HGBA1C in the last 72 hours. CBG: No results for input(s): GLUCAP in the last 168 hours. Lipid Profile: No results for input(s): CHOL, HDL, LDLCALC, TRIG, CHOLHDL, LDLDIRECT in the last 72 hours. Thyroid Function Tests: No results for input(s): TSH, T4TOTAL, FREET4, T3FREE, THYROIDAB in the last 72 hours. Anemia Panel: No results for input(s): VITAMINB12, FOLATE, FERRITIN, TIBC, IRON, RETICCTPCT in the last 72 hours. Urine analysis:    Component Value Date/Time   COLORURINE YELLOW 06/14/2020 0640   APPEARANCEUR HAZY (A) 06/14/2020 0640   LABSPEC 1.014 06/14/2020 0640   PHURINE 5.0 06/14/2020 0640   GLUCOSEU NEGATIVE 06/14/2020 0640   HGBUR NEGATIVE  06/14/2020 0640   BILIRUBINUR NEGATIVE 06/14/2020 0640   KETONESUR NEGATIVE 06/14/2020 0640   PROTEINUR NEGATIVE 06/14/2020 0640   NITRITE NEGATIVE 06/14/2020 0640   LEUKOCYTESUR NEGATIVE 06/14/2020 0640   Sepsis Labs: @LABRCNTIP (procalcitonin:4,lacticidven:4)  ) Recent Results (from the past 240 hour(s))  Culture, blood (single)     Status: None (Preliminary result)   Collection Time: 06/14/20  1:33 AM   Specimen: BLOOD  Result Value Ref Range Status   Specimen Description BLOOD RIGHT ARM  Final   Special Requests   Final    BOTTLES DRAWN AEROBIC AND ANAEROBIC Blood Culture adequate volume   Culture   Final    NO GROWTH 3 DAYS Performed at [facility], [address] [place], [place] [address]    Report Status PENDING  Incomplete  Culture, blood (single)     Status: None (Preliminary result)   Collection Time: 06/14/20  1:42 AM   Specimen: BLOOD RIGHT HAND  Result Value Ref Range Status   Specimen Description BLOOD RIGHT HAND  Final   Special Requests   Final    BOTTLES DRAWN AEROBIC ONLY Blood Culture adequate volume   Culture   Final     NO GROWTH 3 DAYS Performed at [facility], [address] [place], [place] [address]    Report Status PENDING  Incomplete  Resp Panel by RT-PCR (Flu A&B, Covid) Nasopharyngeal Swab     Status: None   Collection Time: 06/14/20  2:39 AM   Specimen: Nasopharyngeal Swab; Nasopharyngeal(NP) swabs in vial transport medium  Result Value Ref Range Status   SARS Coronavirus 2 by RT PCR NEGATIVE NEGATIVE Final    Comment: (NOTE) SARS-CoV-2 target nucleic acids are NOT DETECTED.  The SARS-CoV-2 RNA is generally detectable in upper respiratory specimens during the acute phase of infection. The lowest concentration of SARS-CoV-2 viral copies this assay can detect is 138 copies/mL. A negative result does not preclude SARS-Cov-2 infection and should not be used as the sole basis for treatment or other patient management decisions. A negative result may occur with  improper specimen collection/handling, submission of specimen other than nasopharyngeal swab, presence of viral mutation(s) within the areas targeted by this assay, and inadequate number of viral copies(<138 copies/mL). A negative result must be combined with clinical observations, patient history, and epidemiological information. The expected result is Negative.  Fact Sheet for Patients:  BloggerCourse.comhttps://www.fda.gov/media/152166/download  Fact Sheet for Healthcare Providers:  SeriousBroker.ithttps://www.fda.gov/media/152162/download  This test is no t yet approved or cleared by the [place] FDA and  has been authorized for detection and/or diagnosis of SARS-CoV-2 by FDA under an Emergency Use Authorization (EUA). This EUA will remain  in effect (meaning this test can be used) for the duration of the COVID-19 declaration under Section 564(b)(1) of the Act, 21 U.S.C.section 360bbb-3(b)(1), unless the authorization is terminated  or revoked sooner.       Influenza A by PCR NEGATIVE NEGATIVE Final   Influenza B by PCR NEGATIVE NEGATIVE  Final    Comment: (NOTE) The Xpert Xpress SARS-CoV-2/FLU/RSV plus assay is intended as an aid in the diagnosis of influenza from Nasopharyngeal swab specimens and should not be used as a sole basis for treatment. Nasal washings and aspirates are unacceptable for Xpert Xpress SARS-CoV-2/FLU/RSV testing.  Fact Sheet for Patients: BloggerCourse.comhttps://www.fda.gov/media/152166/download  Fact Sheet for Healthcare Providers: SeriousBroker.ithttps://www.fda.gov/media/152162/download  This test is not yet approved or cleared by the [place] FDA and has been authorized for detection and/or diagnosis of SARS-CoV-2 by  FDA under an Emergency Use Authorization (EUA). This EUA will remain in effect (meaning this test can be used) for the duration of the COVID-19 declaration under Section 564(b)(1) of the Act, 21 U.S.C. section 360bbb-3(b)(1), unless the authorization is terminated or revoked.  Performed at [facility], [address], [place], [place] [address]   Expectorated Sputum Assessment w Gram Stain, Rflx to Resp Cult     Status: None   Collection Time: 06/17/20  7:30 AM   Specimen: Expectorated Sputum  Result Value Ref Range Status   Specimen Description EXPECTORATED SPUTUM  Final   Special Requests Normal  Final   Sputum evaluation   Final    THIS SPECIMEN IS ACCEPTABLE FOR SPUTUM CULTURE Performed at [facility], [address] [place], [place] [address]    Report Status 06/17/2020 FINAL  Final  Culture, Respiratory w Gram Stain     Status: None (Preliminary result)   Collection Time: 06/17/20  7:30 AM  Result Value Ref Range Status   Specimen Description EXPECTORATED SPUTUM  Final   Special Requests Normal Reflexed from [id number]  Final   Gram Stain   Final    RARE WBC PRESENT,BOTH PMN AND MONONUCLEAR RARE GRAM POSITIVE COCCI IN PAIRS IN CLUSTERS RARE GRAM NEGATIVE COCCOBACILLI Performed at [facility], [address], [place], [place] [address]    Culture PENDING  Incomplete    Report Status PENDING  Incomplete      Studies: No results found.  Scheduled Meds: . enoxaparin (LOVENOX) injection  40 mg Subcutaneous Q24H  . magnesium oxide  400 mg Oral BID  . methylPREDNISolone (SOLU-MEDROL) injection  40 mg Intravenous Q12H  . pantoprazole  40 mg Oral Daily    Continuous Infusions: . azithromycin 500 mg (06/17/20 0510)  . cefTRIAXone (ROCEPHIN)  IV 2 g (06/17/20 0324)     LOS: 3 days     Darlin Drop, MD [facility] Pager [phone]  If 7PM-7AM, please contact night-coverage [email or web address] Password [facility] 06/17/2020, 5:37 PM

## 2020-06-18 LAB — CBC
HCT: 36.1 % — ABNORMAL LOW (ref 39.0–52.0)
Hemoglobin: 12.2 g/dL — ABNORMAL LOW (ref 13.0–17.0)
MCH: 33.3 pg (ref 26.0–34.0)
MCHC: 33.8 g/dL (ref 30.0–36.0)
MCV: 98.6 fL (ref 80.0–100.0)
Platelets: 721 10*3/uL — ABNORMAL HIGH (ref 150–400)
RBC: 3.66 MIL/uL — ABNORMAL LOW (ref 4.22–5.81)
RDW: 12.7 % (ref 11.5–15.5)
WBC: 11.3 10*3/uL — ABNORMAL HIGH (ref 4.0–10.5)
nRBC: 0 % (ref 0.0–0.2)

## 2020-06-18 LAB — PROCALCITONIN: Procalcitonin: 0.45 ng/mL

## 2020-06-18 MED ORDER — PREDNISONE 20 MG PO TABS: 20.0000 mg | ORAL_TABLET | Freq: Every day | ORAL | 0 refills | Status: AC

## 2020-06-18 MED ORDER — ALBUTEROL SULFATE HFA 108 (90 BASE) MCG/ACT IN AERS: 2.0000 | INHALATION_SPRAY | RESPIRATORY_TRACT | 0 refills | Status: AC | PRN

## 2020-06-18 MED ORDER — AMOXICILLIN-POT CLAVULANATE 875-125 MG PO TABS
1.0000 | ORAL_TABLET | Freq: Two times a day (BID) | ORAL | Status: DC
  Administered 2020-06-18: 1 via ORAL
  Filled 2020-06-18: qty 1

## 2020-06-18 MED ORDER — AMOXICILLIN-POT CLAVULANATE 875-125 MG PO TABS: 1.0000 | ORAL_TABLET | Freq: Two times a day (BID) | ORAL | 0 refills | Status: AC

## 2020-06-18 MED ORDER — PREDNISONE 20 MG PO TABS
20.0000 mg | ORAL_TABLET | Freq: Every day | ORAL | Status: DC
  Administered 2020-06-18: 20 mg via ORAL
  Filled 2020-06-18: qty 1

## 2020-06-18 NOTE — Evaluation (Signed)
Occupational Therapy Evaluation/Discharge Patient Details Name: Richard Robinson MRN: [id number] DOB: 1965-08-01 Today's Date: 06/18/2020    History of Present Illness Richard Robinson is a 55 y.o. male with medical history significant of asthma. Pt presents to the ED with 1 week history of fever, cough, runny nose, sore throat, generalized weakness and SOB. Initially requiring 6 L O2 to maintain O2 sats   Clinical Impression   PTA, pt lives with spouse and reports Independence in all daily tasks, mobility without AD and works at [employer]. Pt presents now at baseline for ADLs/mobility without AD. Pt able to ambulate in hallway, manage 4 steps with use of railing Independently. SpO2 >93% on RA, no LOB or SOB noted. No further skilled OT services needed at acute level or on discharge. OT to sign off.     Follow Up Recommendations  No OT follow up    Equipment Recommendations  None recommended by OT    Recommendations for Other Services       Precautions / Restrictions Precautions Precautions: Fall Restrictions Weight Bearing Restrictions: No      Mobility Bed Mobility Overal bed mobility: Independent                  Transfers Overall transfer level: Independent Equipment used: None                  Balance                                           ADL either performed or assessed with clinical judgement   ADL Overall ADL's : Independent                                       General ADL Comments: Able to don socks, ambulate in hallway without AD and manage stairs without assistance     Vision Baseline Vision/History: No visual deficits Patient Visual Report: No change from baseline Vision Assessment?: No apparent visual deficits     Perception     Praxis      Pertinent Vitals/Pain Pain Assessment: No/denies pain     Hand Dominance Right   Extremity/Trunk Assessment Upper Extremity Assessment Upper Extremity  Assessment: Overall WFL for tasks assessed   Lower Extremity Assessment Lower Extremity Assessment: Defer to PT evaluation   Cervical / Trunk Assessment Cervical / Trunk Assessment: Normal   Communication Communication Communication: No difficulties   Cognition Arousal/Alertness: Awake/alert Behavior During Therapy: WFL for tasks assessed/performed Overall Cognitive Status: Within Functional Limits for tasks assessed                                     General Comments  Received on RA, SpO2 93% and above. encouraged pt/spouse to consider obtaining pulse ox to assist in monitoring O2 levels    Exercises     Shoulder Instructions      Home Living Family/patient expects to be discharged to:: Private residence Living Arrangements: Spouse/significant other Available Help at Discharge: Family;Available 24 hours/day Type of Home: House Home Access: Stairs to enter [employer] of Steps: 2         Bathroom Shower/Tub: Chief Strategy Officer: Standard  Prior Functioning/Environment Level of Independence: Independent        Comments: works night shift at [employer], no use of AD for mobility        OT Problem List:        OT Treatment/Interventions:      OT Goals(Current goals can be found in the care plan section) Acute Rehab OT Goals Patient Stated Goal: go back to work when ready OT Goal Formulation: All assessment and education complete, DC therapy  OT Frequency:     Barriers to D/C:            Co-evaluation              AM-PAC OT "6 Clicks" Daily Activity     Outcome Measure Help from another person eating meals?: None Help from another person taking care of personal grooming?: None Help from another person toileting, which includes using toliet, bedpan, or urinal?: None Help from another person bathing (including washing, rinsing, drying)?: None Help from another person to put on and  taking off regular upper body clothing?: None Help from another person to put on and taking off regular lower body clothing?: None 6 Click Score: 24   End of Session Nurse Communication: Mobility status  Activity Tolerance: Patient tolerated treatment well Patient left: in bed;with call bell/phone within reach;with family/visitor present  OT Visit Diagnosis: Other (comment) (decreased cardiopulmonary tolerance)                Time: 8413-2440 OT Time Calculation (min): 20 min Charges:  OT General Charges $OT Visit: 1 Visit OT Evaluation $OT Eval Low Complexity: 1 Low  Bradd Canary, OTR/L Acute Rehab Services Office: [phone]  Lorre Munroe 06/18/2020, 8:37 AM

## 2020-06-18 NOTE — Clinical Social Work Note (Signed)
   Mr. Shear requested and was provided with a statement verifying that he has been in the hospital from admission until his discharge today.  Genelle Bal, LCSW [phone]

## 2020-06-18 NOTE — Discharge Instructions (Signed)
Managing the Challenge of Quitting Smoking Quitting smoking is a physical and mental challenge. You will face cravings, withdrawal symptoms, and temptation. Before quitting, work with your health care provider to make a plan that can help you manage quitting. Preparation can help you quit and keep you from giving in. How to manage lifestyle changes Managing stress Stress can make you want to smoke, and wanting to smoke may cause stress. It is important to find ways to manage your stress. You might try some of the following:  Practice relaxation techniques. ? Breathe slowly and deeply, in through your nose and out through your mouth. ? Listen to music. ? Soak in a bath or take a shower. ? Imagine a peaceful place or vacation.  Get some support. ? Talk with family or friends about your stress. ? Join a support group. ? Talk with a counselor or therapist.  Get some physical activity. ? Go for a walk, run, or bike ride. ? Play a favorite sport. ? Practice yoga.   Medicines Talk with your health care provider about medicines that might help you deal with cravings and make quitting easier for you. Relationships Social situations can be difficult when you are quitting smoking. To manage this, you can:  Avoid parties and other social situations where people might be smoking.  Avoid alcohol.  Leave right away if you have the urge to smoke.  Explain to your family and friends that you are quitting smoking. Ask for support and let them know you might be a bit grumpy.  Plan activities where smoking is not an option. General instructions Be aware that many people gain weight after they quit smoking. However, not everyone does. To keep from gaining weight, have a plan in place before you quit and stick to the plan after you quit. Your plan should include:  Having healthy snacks. When you have a craving, it may help to: ? Eat popcorn, carrots, celery, or other cut vegetables. ? Chew  sugar-free gum.  Changing how you eat. ? Eat small portion sizes at meals. ? Eat 4-6 small meals throughout the day instead of 1-2 large meals a day. ? Be mindful when you eat. Do not watch television or do other things that might distract you as you eat.  Exercising regularly. ? Make time to exercise each day. If you do not have time for a long workout, do short bouts of exercise for 5-10 minutes several times a day. ? Do some form of strengthening exercise, such as weight lifting. ? Do some exercise that gets your heart beating and causes you to breathe deeply, such as walking fast, running, swimming, or biking. This is very important.  Drinking plenty of water or other low-calorie or no-calorie drinks. Drink 6-8 glasses of water daily.   How to recognize withdrawal symptoms Your body and mind may experience discomfort as you try to get used to not having nicotine in your system. These effects are called withdrawal symptoms. They may include:  Feeling hungrier than normal.  Having trouble concentrating.  Feeling irritable or restless.  Having trouble sleeping.  Feeling depressed.  Craving a cigarette. To manage withdrawal symptoms:  Avoid places, people, and activities that trigger your cravings.  Remember why you want to quit.  Get plenty of sleep.  Avoid coffee and other caffeinated drinks. These may worsen some of your symptoms. These symptoms may surprise you. But be assured that they are normal to have when quitting smoking. How to manage cravings   Come up with a plan for how to deal with your cravings. The plan should include the following:  A definition of the specific situation you want to deal with.  An alternative action you will take.  A clear idea for how this action will help.  The name of someone who might help you with this. Cravings usually last for 5-10 minutes. Consider taking the following actions to help you with your plan to deal with  cravings:  Keep your mouth busy. ? Chew sugar-free gum. ? Suck on hard candies or a straw. ? Brush your teeth.  Keep your hands and body busy. ? Change to a different activity right away. ? Squeeze or play with a ball. ? Do an activity or a hobby, such as making bead jewelry, practicing needlepoint, or working with wood. ? Mix up your normal routine. ? Take a short exercise break. Go for a quick walk or run up and down stairs.  Focus on doing something kind or helpful for someone else.  Call a friend or family member to talk during a craving.  Join a support group.  Contact a quitline. Where to find support To get help or find a support group:  Call the National Cancer Institute's Smoking Quitline: 1-800-QUIT NOW (619) 337-0632)  Visit the website of the Substance Abuse and Mental Health Services Administration: SkateOasis.com.pt  Text QUIT to SmokefreeTXT: 557322 Where to find more information Visit these websites to find more information on quitting smoking:  National Cancer Institute: www.smokefree.gov  American Lung Association: www.lung.org  American Cancer Society: www.cancer.org  Centers for Disease Control and Prevention: FootballExhibition.com.br  American Heart Association: www.heart.org Contact a health care provider if:  You want to change your plan for quitting.  The medicines you are taking are not helping.  Your eating feels out of control or you cannot sleep. Get help right away if:  You feel depressed or become very anxious. Summary  Quitting smoking is a physical and mental challenge. You will face cravings, withdrawal symptoms, and temptation to smoke again. Preparation can help you as you go through these challenges.  Try different techniques to manage stress, handle social situations, and prevent weight gain.  You can deal with cravings by keeping your mouth busy (such as by chewing gum), keeping your hands and body busy, calling family or friends, or  contacting a quitline for people who want to quit smoking.  You can deal with withdrawal symptoms by avoiding places where people smoke, getting plenty of rest, and avoiding drinks with caffeine. This information is not intended to replace advice given to you by your health care provider. Make sure you discuss any questions you have with your health care provider. Document Revised: 12/21/2018 Document Reviewed: 12/21/2018 Elsevier Patient Education  2021 [employer] Asthma, Adult  Asthma is a long-term (chronic) condition in which the airways get tight and narrow. The airways are the breathing passages that lead from the nose and mouth down into the lungs. A person with asthma will have times when symptoms get worse. These are called asthma attacks. They can cause coughing, whistling sounds when you breathe (wheezing), shortness of breath, and chest pain. They can make it hard to breathe. There is no cure for asthma, but medicines and lifestyle changes can help control it. There are many things that can bring on an asthma attack or make asthma symptoms worse (triggers). Common triggers include:  Mold.  Dust.  Cigarette smoke.  Cockroaches.  Things that can cause allergy  symptoms (allergens). These include animal skin flakes (dander) and pollen from trees or grass.  Things that pollute the air. These may include household cleaners, wood smoke, smog, or chemical odors.  Cold air, weather changes, and wind.  Crying or laughing hard.  Stress.  Certain medicines or drugs.  Certain foods such as dried fruit, potato chips, and grape juice.  Infections, such as a cold or the flu.  Certain medical conditions or diseases.  Exercise or tiring activities. Asthma may be treated with medicines and by staying away from the things that cause asthma attacks. Types of medicines may include:  Controller medicines. These help prevent asthma symptoms. They are usually taken every  day.  Fast-acting reliever or rescue medicines. These quickly relieve asthma symptoms. They are used as needed and provide short-term relief.  Allergy medicines if your attacks are brought on by allergens.  Medicines to help control the body's defense (immune) system. Follow these instructions at home: Avoiding triggers in your home  Change your heating and air conditioning filter often.  Limit your use of fireplaces and wood stoves.  Get rid of pests (such as roaches and mice) and their droppings.  Throw away plants if you see mold on them.  Clean your floors. Dust regularly. Use cleaning products that do not smell.  Have someone vacuum when you are not home. Use a vacuum cleaner with a HEPA filter if possible.  Replace carpet with wood, tile, or vinyl flooring. Carpet can trap animal skin flakes and dust.  Use allergy-proof pillows, mattress covers, and box spring covers.  Wash bed sheets and blankets every week in hot water. Dry them in a dryer.  Keep your bedroom free of any triggers.  Avoid pets and keep windows closed when things that cause allergy symptoms are in the air.  Use blankets that are made of polyester or cotton.  Clean bathrooms and kitchens with bleach. If possible, have someone repaint the walls in these rooms with mold-resistant paint. Keep out of the rooms that are being cleaned and painted.  Wash your hands often with soap and water. If soap and water are not available, use hand sanitizer.  Do not allow anyone to smoke in your home. General instructions  Take over-the-counter and prescription medicines only as told by your doctor. ? Talk with your doctor if you have questions about how or when to take your medicines. ? Make note if you need to use your medicines more often than usual.  Do not use any products that contain nicotine or tobacco, such as cigarettes and e-cigarettes. If you need help quitting, ask your doctor.  Stay away from  secondhand smoke.  Avoid doing things outdoors when allergen counts are high and when air quality is low.  Wear a ski mask when doing outdoor activities in the winter. The mask should cover your nose and mouth. Exercise indoors on cold days if you can.  Warm up before you exercise. Take time to cool down after exercise.  Use a peak flow meter as told by your doctor. A peak flow meter is a tool that measures how well the lungs are working.  Keep track of the peak flow meter's readings. Write them down.  Follow your asthma action plan. This is a written plan for taking care of your asthma and treating your attacks.  Make sure you get all the shots (vaccines) that your doctor recommends. Ask your doctor about a flu shot and a pneumonia shot.  Keep all  follow-up visits as told by your doctor. This is important. Contact a doctor if:  You have wheezing, shortness of breath, or a cough even while taking medicine to prevent attacks.  The mucus you cough up (sputum) is thicker than usual.  The mucus you cough up changes from clear or white to yellow, green, gray, or bloody.  You have problems from the medicine you are taking, such as: ? A rash. ? Itching. ? Swelling. ? Trouble breathing.  You need reliever medicines more than 2-3 times a week.  Your peak flow reading is still at 50-79% of your personal best after following the action plan for 1 hour.  You have a fever. Get help right away if:  You seem to be worse and are not responding to medicine during an asthma attack.  You are short of breath even at rest.  You get short of breath when doing very little activity.  You have trouble eating, drinking, or talking.  You have chest pain or tightness.  You have a fast heartbeat.  Your lips or fingernails start to turn blue.  You are light-headed or dizzy, or you faint.  Your peak flow is less than 50% of your personal best.  You feel too tired to breathe  normally. Summary  Asthma is a long-term (chronic) condition in which the airways get tight and narrow. An asthma attack can make it hard to breathe.  Asthma cannot be cured, but medicines and lifestyle changes can help control it.  Make sure you understand how to avoid triggers and how and when to use your medicines. This information is not intended to replace advice given to you by your health care provider. Make sure you discuss any questions you have with your health care provider. Document Revised: 07/06/2019 Document Reviewed: 07/06/2019 Elsevier Patient Education  2021 [employer].

## 2020-06-18 NOTE — Progress Notes (Signed)
DISCHARGE NOTE HOME [facility] [employer] to be discharged Home per MD order. Discussed prescriptions and follow up appointments with the patient. Prescriptions given to patient; medication list explained in detail. Patient verbalized understanding.  Skin clean, dry and intact without evidence of skin break down, no evidence of skin tears noted. IV catheter discontinued intact. Site without signs and symptoms of complications. Dressing and pressure applied. Pt denies pain at the site currently. No complaints noted.  Patient free of lines, drains, and wounds.   An After Visit Summary (AVS) was printed and given to the patient. Patient escorted via wheelchair, and discharged home via private auto.  Velia Meyer, RN

## 2020-06-18 NOTE — Discharge Summary (Addendum)
Discharge Summary  Richard Robinson JYN:[id number] DOB: 1965-07-07  PCP: Patient, No Pcp Per (Inactive)  Admit date: 06/14/2020 Discharge date: 06/18/2020  Time spent: 35 minutes.  Recommendations for Outpatient Follow-up:  1. Follow-up with pulmonary.  An appointment has been made for you on 07/19/2020 at 11:30 AM.  Please arrive 30 minutes in advance. 2. Follow-up with your primary care provider in 1 to 2 weeks. 3. Take your medications as prescribed. 4. Completely abstain from tobacco use. 5. Repeat chest x-ray in 4 weeks on 07/19/2020 at either pulmonary's office or your primary care physician's office.  Discharge Diagnoses:  Active Hospital Problems   Diagnosis Date Noted  . Community acquired pneumonia of left upper lobe of lung 06/14/2020  . Sepsis (HCC) 06/14/2020  . AKI (acute kidney injury) (HCC) 06/14/2020  . Hyponatremia 06/14/2020  . Acute respiratory failure with hypoxia (HCC) 06/14/2020    Resolved Hospital Problems   Diagnosis Date Noted Date Resolved  . Left upper lobe pneumonia 06/14/2020 06/14/2020    Discharge Condition: Stable.  Diet recommendation: Resume previous diet.  Vitals:   06/17/20 2201 06/18/20 0518  BP: (!) 158/91 (!) 155/98  Pulse: (!) 57 72  Resp: 18 16  Temp: 97.7 F (36.5 C) 98 F (36.7 C)  SpO2: 97% 94%    History of present illness:  Patient is a 55 years old male with history of smoking, asthma presented to hospital with fever cough,runny nose,sore throat,generalized weakness and shortness of breath. Patient does not use oxygen at baseline and has inhalers at home for asthma. In the ED,patient was noted to have left upper lobe pneumonia on the chest x-ray and was noted to febrile with a temperature of 103 F and blood pressure was low. Patient received 1 L of normal saline bolus and needed 7 L high flow nasal cannula initially to maintain O2 saturation greater than 92%.  He received 4 days of Rocephin, IV azithromycin as well as  bronchodilators and IV steroids.  He was weaned off oxygen supplementation and hypoxemia has resolved.  Currently O2 saturation 94% on room air with ambulation.  06/18/20:  Patient was seen at the bedside, his wife was present.  There were no acute events overnight.  He denies dyspnea, palpitations or chest pain.  He has no new complaints.  He is eager to go home.  Hospital Course:  Principal Problem:   Community acquired pneumonia of left upper lobe of lung Active Problems:   Sepsis (HCC)   AKI (acute kidney injury) (HCC)   Hyponatremia   Acute respiratory failure with hypoxia (HCC)  Sepsis secondary to left upper lobe community-acquired pneumonia, POA Onpresentation.,  Patient had tachycardia, tachypnea, elevated lactate, leukocytosiswith pneumonia as a source of infection.  Received IV fluids with improvement of lactate to 1.5.  Covid test and flu were negative. Blood cultures negative to date.  Received 4 days ofRocephin and IV Zithromax. Received 4 days of IV Solu-Medrol. Initially required 7 L HFNC to maintain O2 saturation greater than 92% Weaned off, currently off oxygen supplementation.   Switched to Augmentin 875/125 milligrams twice daily x5 days Switched to prednisone 20 mg daily x3 days. Repeat chest x-ray in 4 weeks on 07/19/2020 at either pulmonary's office or your primary care physician's office. Patient and wife were advised of this recommendation.  They understand and agree with the plan.  History of asthma,history of smoking. Tobacco cessation counseling done at bedside. Albuterol as needed for wheezing or shortness of breath. Complete course of antibiotics  Received 4 days of IV Solu-Medrol. Switched to Augmentin 875/125 milligrams twice daily x5 days Switched to prednisone 20 mg daily x3 days. Follow-up with pulmonary.  An appointment has been made for you on 07/19/2020 at 11:30 AM.  Please arrive 30 minutes in advance.  Resolved acute hypoxic  respiratory failure secondary to pneumonia  Resolved post repletion: Hypokalemia. Serum potassium 3.7 Serum magnesium 2.3  Mild hyponatremia, asymptomatic. Initially improved with IV fluids.  Latest sodium of 134 today.  Increase oral intake.  Resolved acute kidney injury Resolved after IV fluids.  Creatinine is back to baseline creatinine 0.8 with GFR greater than 60.    Code Status:Full code  Family Communication:Updated his wife at bedside on 06/18/2020.  Consultants:  None  Procedures:  None  Anti-infectives:   Rocephin and Zithromax3/31> 06/18/20  Augmentin 875/125 mg twice daily x5 days, started on 06/18/2020.    Discharge Exam: BP (!) 155/98 (BP Location: Right Arm)   Pulse 72   Temp 98 F (36.7 C) (Oral)   Resp 16   Ht  (1.727 m)   SpO2 94%   BMI 22.81 kg/m  . General: 55 y.o. year-old male well developed well nourished in no acute distress.  Alert and oriented x3. . Cardiovascular: Regular rate and rhythm with no rubs or gallops.  No thyromegaly or JVD noted.   Marland Kitchen Respiratory: Clear to auscultation with no wheezes or rales. Good inspiratory effort. . Abdomen: Soft nontender nondistended with normal bowel sounds x4 quadrants. . Musculoskeletal: No lower extremity edema. 2/4 pulses in all 4 extremities. . Skin: No ulcerative lesions noted or rashes, . Psychiatry: Mood is appropriate for condition and setting  Discharge Instructions You were cared for by a hospitalist during your hospital stay. If you have any questions about your discharge medications or the care you received while you were in the hospital after you are discharged, you can call the unit and asked to speak with the hospitalist on call if the hospitalist that took care of you is not available. Once you are discharged, your primary care physician will handle any further medical issues. Please note that NO REFILLS for any discharge medications will be authorized once you are  discharged, as it is imperative that you return to your primary care physician (or establish a relationship with a primary care physician if you do not have one) for your aftercare needs so that they can reassess your need for medications and monitor your lab values.   Allergies as of 06/18/2020   No Known Allergies     Medication List    STOP taking these medications   NYQUIL PO     TAKE these medications   albuterol 108 (90 Base) MCG/ACT inhaler Commonly known as: VENTOLIN HFA Inhale 2 puffs into the lungs every 4 (four) hours as needed for wheezing or shortness of breath.   amoxicillin-clavulanate 875-125 MG tablet Commonly known as: AUGMENTIN Take 1 tablet by mouth every 12 (twelve) hours for 5 days.   predniSONE 20 MG tablet Commonly known as: DELTASONE Take 1 tablet (20 mg total) by mouth daily with breakfast for 3 days.      No Known Allergies  Follow-up Information    Martina Sinner, MD. Call in 1 day(s).   Specialty: Pulmonary Disease Why: An appointment has been scheduled for you: Please arrive 30 mins in advance, 07/19/20 at 11:30 AM.  Thank you. Contact information: [address] [place] [address] [phone]  [facility]. Call in 1 day(s).   Why: please call for a post hospital follow up appointment Contact information: [address] [facility] [id number] [phone]               The results of significant diagnostics from this hospitalization (including imaging, microbiology, ancillary and laboratory) are listed below for reference.    Significant Diagnostic Studies: DG Chest Port 1 View  Result Date: 06/14/2020 CLINICAL DATA:  Fever, dyspnea EXAM: PORTABLE CHEST 1 VIEW COMPARISON:  09/19/2016 FINDINGS: There is focal airspace infiltrate within the left upper lobe in keeping with acute lobar pneumonia in the appropriate clinical setting. Mild left basilar  atelectasis. Right lung is clear. No pneumothorax or pleural effusion. Cardiac size is within normal limits. Pulmonary vascularity is normal. No acute bone abnormality. IMPRESSION: Left upper lobe focal pneumonic infiltrate. Follow-up chest radiograph is recommended in 3-4 weeks to document complete resolution. Electronically Signed   By: Helyn Numbers MD   On: 06/14/2020 01:52    Microbiology: Recent Results (from the past 240 hour(s))  Culture, blood (single)     Status: None (Preliminary result)   Collection Time: 06/14/20  1:33 AM   Specimen: BLOOD  Result Value Ref Range Status   Specimen Description BLOOD RIGHT ARM  Final   Special Requests   Final    BOTTLES DRAWN AEROBIC AND ANAEROBIC Blood Culture adequate volume   Culture   Final    NO GROWTH 3 DAYS Performed at [facility], [address] [place], [place] [address]    Report Status PENDING  Incomplete  Culture, blood (single)     Status: None (Preliminary result)   Collection Time: 06/14/20  1:42 AM   Specimen: BLOOD RIGHT HAND  Result Value Ref Range Status   Specimen Description BLOOD RIGHT HAND  Final   Special Requests   Final    BOTTLES DRAWN AEROBIC ONLY Blood Culture adequate volume   Culture   Final    NO GROWTH 3 DAYS Performed at [facility], [address], [place], [place] [address]    Report Status PENDING  Incomplete  Resp Panel by RT-PCR (Flu A&B, Covid) Nasopharyngeal Swab     Status: None   Collection Time: 06/14/20  2:39 AM   Specimen: Nasopharyngeal Swab; Nasopharyngeal(NP) swabs in vial transport medium  Result Value Ref Range Status   SARS Coronavirus 2 by RT PCR NEGATIVE NEGATIVE Final    Comment: (NOTE) SARS-CoV-2 target nucleic acids are NOT DETECTED.  The SARS-CoV-2 RNA is generally detectable in upper respiratory specimens during the acute phase of infection. The lowest concentration of SARS-CoV-2 viral copies this assay can detect is 138 copies/mL. A negative result does  not preclude SARS-Cov-2 infection and should not be used as the sole basis for treatment or other patient management decisions. A negative result may occur with  improper specimen collection/handling, submission of specimen other than nasopharyngeal swab, presence of viral mutation(s) within the areas targeted by this assay, and inadequate number of viral copies(<138 copies/mL). A negative result must be combined with clinical observations, patient history, and epidemiological information. The expected result is Negative.  Fact Sheet for Patients:  BloggerCourse.com  Fact Sheet for Healthcare Providers:  SeriousBroker.it  This test is no t yet approved or cleared by the [place] FDA and  has been authorized for detection and/or diagnosis of SARS-CoV-2 by FDA under an Emergency Use Authorization (EUA). This EUA will remain  in effect (meaning this test can be used) for the duration of the COVID-19 declaration under Section 564(b)(1) of the Act, 21 U.S.C.section 360bbb-3(b)(1), unless the authorization is terminated  or revoked sooner.       Influenza A by PCR NEGATIVE NEGATIVE Final   Influenza B by PCR NEGATIVE NEGATIVE Final    Comment: (NOTE) The Xpert Xpress SARS-CoV-2/FLU/RSV plus assay is intended as an aid in the diagnosis of influenza from Nasopharyngeal swab specimens and should not be used as a sole basis for treatment. Nasal washings and aspirates are unacceptable for Xpert Xpress SARS-CoV-2/FLU/RSV testing.  Fact Sheet for Patients: BloggerCourse.com  Fact Sheet for Healthcare Providers: SeriousBroker.it  This test is not yet approved or cleared by the [place] FDA and has been authorized for detection and/or diagnosis of SARS-CoV-2 by FDA under an Emergency Use Authorization (EUA). This EUA will remain in effect (meaning this test can be used) for the  duration of the COVID-19 declaration under Section 564(b)(1) of the Act, 21 U.S.C. section 360bbb-3(b)(1), unless the authorization is terminated or revoked.  Performed at [facility], [address] [place], [place] [address]   Expectorated Sputum Assessment w Gram Stain, Rflx to Resp Cult     Status: None   Collection Time: 06/17/20  7:30 AM   Specimen: Expectorated Sputum  Result Value Ref Range Status   Specimen Description EXPECTORATED SPUTUM  Final   Special Requests Normal  Final   Sputum evaluation   Final    THIS SPECIMEN IS ACCEPTABLE FOR SPUTUM CULTURE Performed at [facility], [address], [place], [place] [address]    Report Status 06/17/2020 FINAL  Final  Culture, Respiratory w Gram Stain     Status: None (Preliminary result)   Collection Time: 06/17/20  7:30 AM  Result Value Ref Range Status   Specimen Description EXPECTORATED SPUTUM  Final   Special Requests Normal Reflexed from [id number]  Final   Gram Stain   Final    RARE WBC PRESENT,BOTH PMN AND MONONUCLEAR RARE GRAM POSITIVE COCCI IN PAIRS IN CLUSTERS RARE GRAM NEGATIVE COCCOBACILLI    Culture   Final    CULTURE REINCUBATED FOR BETTER GROWTH Performed at [facility], [address], [place], [place] [address]    Report Status PENDING  Incomplete     Labs: Basic Metabolic Panel: Recent Labs  Lab 06/14/20 0133 06/14/20 0640 06/15/20 0350 06/16/20 0211  NA 129* 132* 134* 134*  K 3.2* 3.1* 3.3* 3.7  CL 95* 98 100 101  CO2 23 23 24 24   GLUCOSE 138* 122* 146* 145*  BUN 9 10 11 17   CREATININE 1.46* 1.23 0.96 0.84  CALCIUM 8.7* 8.3* 9.0 9.1  MG  --   --  1.9 2.3  PHOS  --   --  3.0  --    Liver Function Tests: Recent Labs  Lab 06/14/20 0133  AST 20  ALT 14  ALKPHOS 38  BILITOT 0.6  PROT 7.3  ALBUMIN 2.5*   No results for input(s): LIPASE, AMYLASE in the last 168 hours. No results for input(s): AMMONIA in the last 168 hours. CBC: Recent Labs  Lab 06/14/20 0133  06/14/20 0640 06/15/20 0350 06/16/20 0211 06/17/20 0604 06/18/20 0726  WBC 15.5* 15.1* 16.2* 19.7* 12.8* 11.3*  NEUTROABS 13.9*  --   --   --   --   --   HGB 14.0 12.9* 12.7* 12.3* 12.0* 12.2*  HCT 40.3 37.8* 36.4* 36.2* 35.3*  36.1*  MCV 98.3 99.2 98.4 99.7 97.8 98.6  PLT 626* 583* 596* 632* 641* 721*   Cardiac Enzymes: No results for input(s): CKTOTAL, CKMB, CKMBINDEX, TROPONINI in the last 168 hours. BNP: BNP (last 3 results) No results for input(s): BNP in the last 8760 hours.  ProBNP (last 3 results) No results for input(s): PROBNP in the last 8760 hours.  CBG: No results for input(s): GLUCAP in the last 168 hours.     Signed:  Darlin Drop, MD [facility] 06/18/2020, 10:23 AM

## 2020-06-18 NOTE — Plan of Care (Signed)
  Problem: Education: Goal: Knowledge of General Education information will improve Description: Including pain rating scale, medication(s)/side effects and non-pharmacologic comfort measures Outcome: Progressing   Problem: Coping: Goal: Level of anxiety will decrease Outcome: Progressing   

## 2020-06-19 LAB — CULTURE, BLOOD (SINGLE)
Culture: NO GROWTH
Culture: NO GROWTH
Special Requests: ADEQUATE
Special Requests: ADEQUATE

## 2020-06-19 LAB — CULTURE, RESPIRATORY W GRAM STAIN
Culture: NORMAL
Special Requests: NORMAL

## 2020-07-19 ENCOUNTER — Institutional Professional Consult (permissible substitution): Payer: Self-pay | Admitting: Pulmonary Disease

## 2020-08-15 ENCOUNTER — Institutional Professional Consult (permissible substitution): Payer: Self-pay | Admitting: Pulmonary Disease

## 2021-10-07 IMAGING — DX DG CHEST 1V PORT
1 series · 1 of 1 positions shown · non-contrast
Comparison: 09/19/2016

CLINICAL DATA: Fever, dyspnea

EXAM:
PORTABLE CHEST 1 VIEW

[chest]
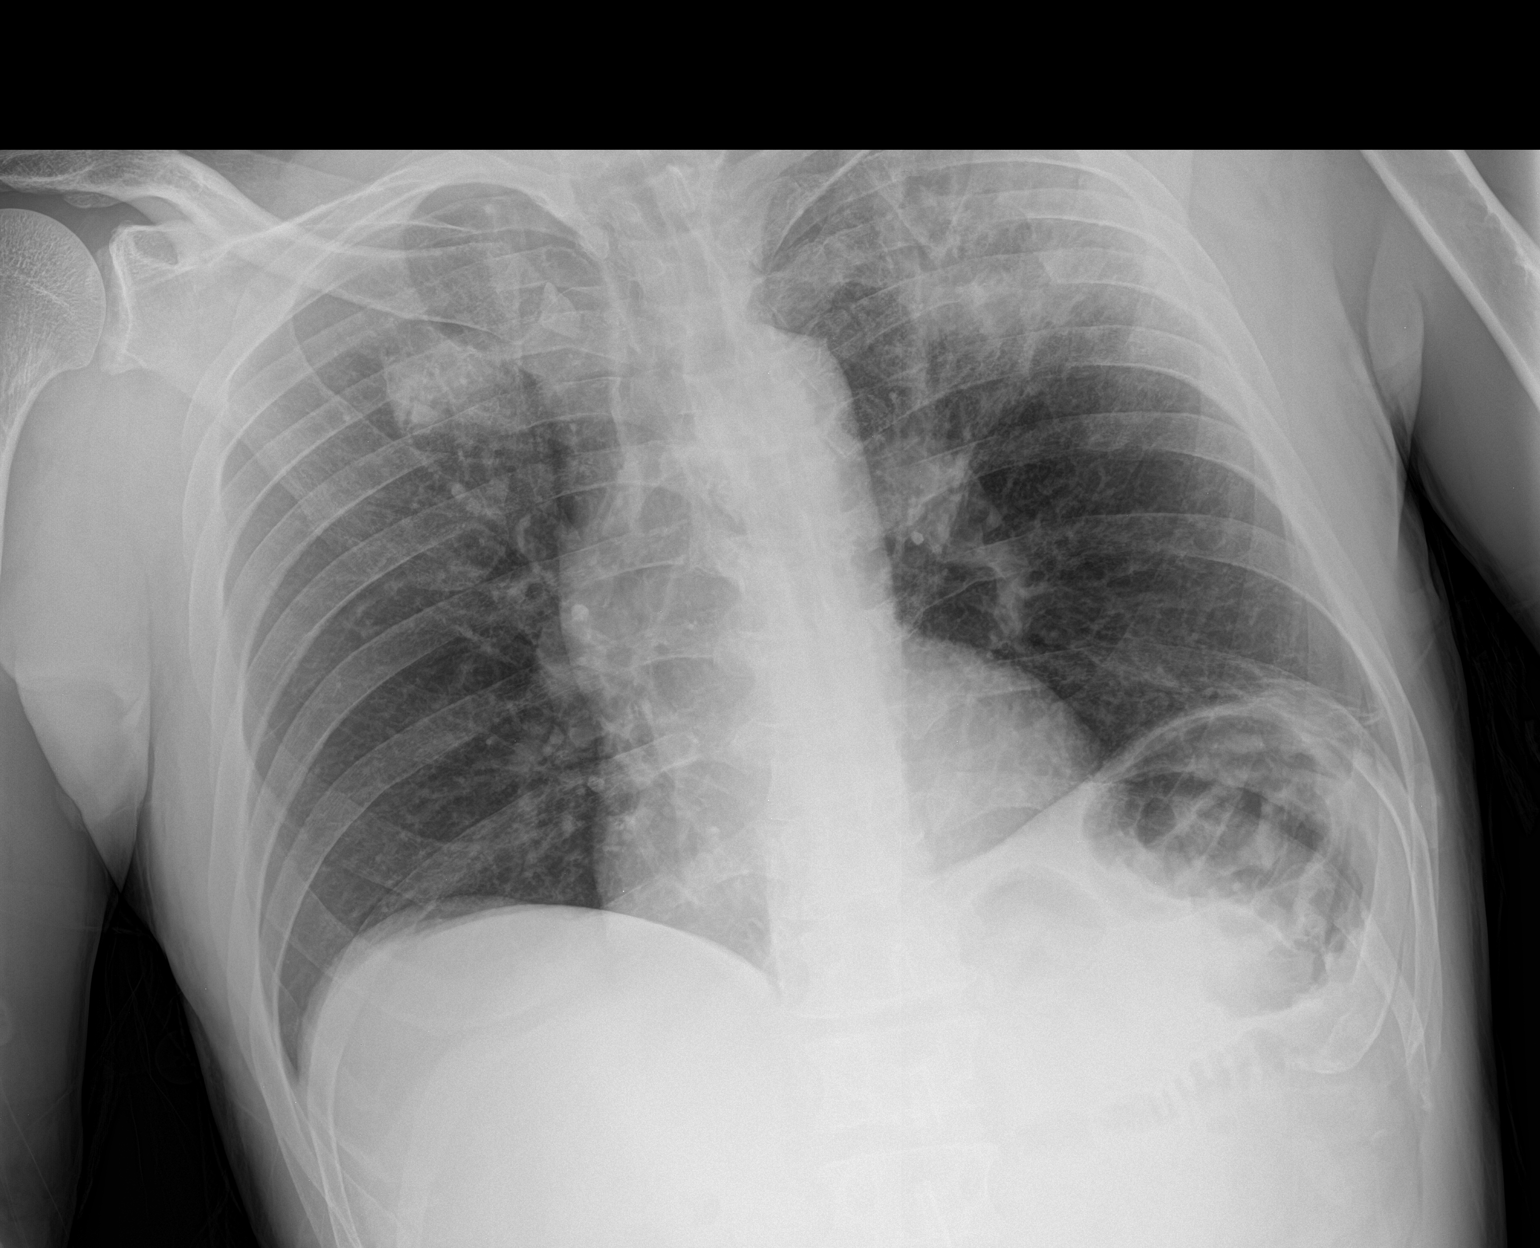

[1 of 1 positions shown; findings below may reference images not displayed]

FINDINGS: There is focal airspace infiltrate within the left upper lobe in
keeping with acute lobar pneumonia in the appropriate clinical
setting. Mild left basilar atelectasis. Right lung is clear. No
pneumothorax or pleural effusion. Cardiac size is within normal
limits. Pulmonary vascularity is normal. No acute bone abnormality.
IMPRESSION: Left upper lobe focal pneumonic infiltrate. Follow-up chest
radiograph is recommended in 3-4 weeks to document complete
resolution.
